# Patient Record
Sex: Female | Born: 1990 | Race: Black or African American | Hispanic: No | Marital: Single | Smoking: Never smoker
Health system: Southern US, Community
[De-identification: ages and names within clinical notes are randomized; demographics above are authoritative.]

## PROBLEM LIST (undated history)

## (undated) DIAGNOSIS — K589 Irritable bowel syndrome without diarrhea: Secondary | ICD-10-CM

## (undated) HISTORY — PX: OTHER SURGICAL HISTORY: SHX169

## (undated) HISTORY — DX: Irritable bowel syndrome without diarrhea: K58.9

---

## 2012-11-05 DIAGNOSIS — K589 Irritable bowel syndrome without diarrhea: Secondary | ICD-10-CM

## 2012-11-05 HISTORY — DX: Irritable bowel syndrome, unspecified: K58.9

## 2013-03-28 ENCOUNTER — Ambulatory Visit (INDEPENDENT_AMBULATORY_CARE_PROVIDER_SITE_OTHER): Payer: BC Managed Care – PPO | Admitting: Emergency Medicine

## 2013-03-28 VITALS — BP 118/80 | HR 93 | Temp 98.5°F | Resp 16 | Ht 67.0 in | Wt 238.4 lb

## 2013-03-28 DIAGNOSIS — E041 Nontoxic single thyroid nodule: Secondary | ICD-10-CM

## 2013-03-28 DIAGNOSIS — R109 Unspecified abdominal pain: Secondary | ICD-10-CM

## 2013-03-28 DIAGNOSIS — R110 Nausea: Secondary | ICD-10-CM

## 2013-03-28 LAB — POCT URINALYSIS DIPSTICK
Bilirubin, UA: NEGATIVE
Blood, UA: NEGATIVE
Nitrite, UA: NEGATIVE
Protein, UA: NEGATIVE
Urobilinogen, UA: 0.2
pH, UA: 6

## 2013-03-28 LAB — POCT CBC
Granulocyte percent: 56.3 %G (ref 37–80)
HCT, POC: 36.8 % — AB (ref 37.7–47.9)
MCHC: 30.4 g/dL — AB (ref 31.8–35.4)
MID (cbc): 0.3 (ref 0–0.9)
MPV: 9.6 fL (ref 0–99.8)
POC Granulocyte: 4.2 (ref 2–6.9)
POC LYMPH PERCENT: 40.3 %L (ref 10–50)
POC MID %: 3.4 %M (ref 0–12)
Platelet Count, POC: 335 10*3/uL (ref 142–424)
RDW, POC: 14.8 %

## 2013-03-28 LAB — POCT UA - MICROSCOPIC ONLY
Casts, Ur, LPF, POC: NEGATIVE
Crystals, Ur, HPF, POC: NEGATIVE
Yeast, UA: NEGATIVE

## 2013-03-28 LAB — POCT URINE PREGNANCY: Preg Test, Ur: NEGATIVE

## 2013-03-28 LAB — COMPREHENSIVE METABOLIC PANEL
ALT: 8 U/L (ref 0–35)
AST: 11 U/L (ref 0–37)
Albumin: 4.1 g/dL (ref 3.5–5.2)
Alkaline Phosphatase: 71 U/L (ref 39–117)
BUN: 10 mg/dL (ref 6–23)
Calcium: 9.7 mg/dL (ref 8.4–10.5)
Chloride: 102 mEq/L (ref 96–112)
Creat: 0.63 mg/dL (ref 0.50–1.10)
Glucose, Bld: 89 mg/dL (ref 70–99)
Potassium: 4.3 mEq/L (ref 3.5–5.3)

## 2013-03-28 LAB — TSH: TSH: 0.946 u[IU]/mL (ref 0.350–4.500)

## 2013-03-28 MED ORDER — ONDANSETRON 8 MG PO TBDP: 8.0000 mg | ORAL_TABLET | Freq: Three times a day (TID) | ORAL | Status: DC | PRN

## 2013-03-28 NOTE — Patient Instructions (Signed)
Taking omeprazole twice a day. He have Zofran at the pharmacy for nausea. If he starts to develop pain in her lower abdomen or fever please been evaluated by a physician. You're going to be scheduled for an ultrasound of abdomen

## 2013-03-28 NOTE — Progress Notes (Addendum)
This chart was scribed for Lesle Chris, MD by Joaquin Music, ED Scribe. This patient was seen in room Room/bed 11 and the patient's care was started at 2:38 PM. Subjective:    Patient ID: Penny Grant, female    DOB: [birth year], 22 y.o.   MRN: [id number] Chief Complaint  Patient presents with  . Abdominal Pain    X this morning, upper middle, feels like "someone pushing"   HPI Penny Grant is a 22 y.o. female with a hx of IVS and constipation who presents to the [facility] complaining of ongoing worsening upper abd pain that began this morning. Pt states she has had recurrent episodes of her current symptoms. Pt states she has been nauseated and reports the need to be by a trash can to avoid episodes of emesis since last night until today. Pt states she was having abd pain that was constant. She reports eating crackers, water, and ginger ale yesterday and states she still had abd pain. Pt states her biggest problem is her unchanged abd pain. Pt denies fever, chills, burning, pain, frequency and itching with urination. Pt LNMP was November 1st 2014. Pt is not sexually active and reports never being sexually active. Pt denies being stressed. Pt is a Physicist, medical and a Furniture conservator/restorer.  Pt states she was seen at an Urgent Care in [place] and was informed she had IVS and constipation. She states she had the same episodes she currently has. Pt reports taking a generic version of Prilosec. She states she begins taking her medications when she begins to feel bloated and "ill". Pt states she began taking her medication 4 days ago with no relief. Pt denies being seen by a GI specialist. Pt states the provider informed her that they did not suspect she had an ulcer. Pt denies having testing done on her gallbladder.  Review of Systems  Constitutional: Negative for fever and chills.  Gastrointestinal: Positive for nausea and abdominal pain. Negative for vomiting and diarrhea.   Genitourinary: Negative for dysuria, frequency, difficulty urinating and dyspareunia.   Objective:   Physical Exam CONSTITUTIONAL: Well developed/well nourished HEAD: Normocephalic/atraumatic EYES: EOMI/PERRL ENMT: Mucous membranes moist NECK: supple no meningeal signs there is a 2 x 3 cm right thyroid nodule present SPINE:entire spine nontender CV: S1/S2 noted, no murmurs/rubs/gallops noted LUNGS: Lungs are clear to auscultation bilaterally, no apparent distress ABDOMEN: soft, nontender, no rebound or guarding GU:no cva tenderness NEURO: Pt is awake/alert, moves all extremitiesx4 EXTREMITIES: pulses normal, full ROM SKIN: warm, color normal PSYCH: no abnormalities of mood noted Results for orders placed in visit on 03/28/13  POCT CBC      Result Value Range   WBC 7.5  4.6 - 10.2 K/uL   Lymph, poc 3.0  0.6 - 3.4   POC LYMPH PERCENT 40.3  10 - 50 %L   MID (cbc) 0.3  0 - 0.9   POC MID % 3.4  0 - 12 %M   POC Granulocyte 4.2  2 - 6.9   Granulocyte percent 56.3  37 - 80 %G   RBC 4.24  4.04 - 5.48 M/uL   Hemoglobin 11.2 (*) 12.2 - 16.2 g/dL   HCT, POC 78.2 (*) 95.6 - 47.9 %   MCV 86.7  80 - 97 fL   MCH, POC 26.4 (*) 27 - 31.2 pg   MCHC 30.4 (*) 31.8 - 35.4 g/dL   RDW, POC 21.3     Platelet Count, POC 335  142 - 424 K/uL  MPV 9.6  0 - 99.8 fL  POCT URINE PREGNANCY      Result Value Range   Preg Test, Ur Negative    POCT URINALYSIS DIPSTICK      Result Value Range   Color, UA yellow     Clarity, UA clear     Glucose, UA neg     Bilirubin, UA neg     Ketones, UA neg     Spec Grav, UA 1.020     Blood, UA neg     pH, UA 6.0     Protein, UA neg     Urobilinogen, UA 0.2     Nitrite, UA neg     Leukocytes, UA small (1+)    POCT UA - MICROSCOPIC ONLY      Result Value Range   WBC, Ur, HPF, POC 1-3     RBC, urine, microscopic 0-1     Bacteria, U Microscopic trace     Mucus, UA neg     Epithelial cells, urine per micros 0-3     Crystals, Ur, HPF, POC neg     Casts, Ur,  LPF, POC neg     Yeast, UA neg      Triage Vitals:BP 118/80  Pulse 93  Temp(Src) 98.5 F (36.9 C) (Oral)  Resp 16  Ht 5\' 7"  (1.702 m)  Wt 238 lb 6.4 oz (108.138 kg)  BMI 37.33 kg/m2  SpO2 100%  LMP 03/08/2013 Assessment & Plan:  The patient had significant improvement after the Zofran. We'll go ahead and schedule ABDOMEN RULE OUT GALLBLADDER DISEASE. SHE WILL INCREASE HER PRILOSEC TO TWICE A DAY AND I WILL CALL IN A PRESCRIPTION FOR ZOFRAN. OTHER LABS WERE DONE AND PENDING. She also needs a thyroid ultrasound done.  I personally performed the services described in this documentation, which was scribed in my presence. The recorded information has been reviewed and is accurate.

## 2013-03-29 LAB — URINE CULTURE
Colony Count: NO GROWTH
Organism ID, Bacteria: NO GROWTH

## 2013-04-08 ENCOUNTER — Ambulatory Visit
Admission: RE | Admit: 2013-04-08 | Discharge: 2013-04-08 | Disposition: A | Discharge status: Home / Self Care | Payer: BC Managed Care – PPO | Source: Ambulatory Visit | Attending: Emergency Medicine | Admitting: Emergency Medicine

## 2013-04-08 DIAGNOSIS — R110 Nausea: Secondary | ICD-10-CM

## 2013-04-08 DIAGNOSIS — R109 Unspecified abdominal pain: Secondary | ICD-10-CM

## 2013-04-08 DIAGNOSIS — E041 Nontoxic single thyroid nodule: Secondary | ICD-10-CM

## 2013-04-09 ENCOUNTER — Telehealth: Payer: Self-pay | Admitting: Radiology

## 2013-04-09 DIAGNOSIS — E041 Nontoxic single thyroid nodule: Secondary | ICD-10-CM

## 2013-04-09 NOTE — Telephone Encounter (Signed)
Called her, left message for her to call me back.  

## 2013-04-09 NOTE — Telephone Encounter (Signed)
Called her to advise.  

## 2013-04-09 NOTE — Telephone Encounter (Signed)
Patient returned call

## 2013-04-09 NOTE — Telephone Encounter (Signed)
Message copied by Caffie Damme on Wed Apr 09, 2013 12:54 PM ------      Message from: Lesle Chris A      Created: Tue Apr 08, 2013  5:50 PM       Called patient and let her now there is a 3 x 2 cm nodule in the right lobe of the thyroid. This area needs to have a biopsy done. Please schedule with interventional radiology and ultrasound and ultrasound-guided biopsy of the lesion. Call the patient and be sure she is agreeable . Her abdominal ultrasound is normal ------

## 2013-04-16 ENCOUNTER — Other Ambulatory Visit (HOSPITAL_COMMUNITY)
Admission: RE | Admit: 2013-04-16 | Discharge: 2013-04-16 | Disposition: A | Discharge status: Home / Self Care | Payer: BC Managed Care – PPO | Source: Ambulatory Visit | Attending: Diagnostic Radiology | Admitting: Diagnostic Radiology

## 2013-04-16 ENCOUNTER — Ambulatory Visit
Admission: RE | Admit: 2013-04-16 | Discharge: 2013-04-16 | Disposition: A | Discharge status: Home / Self Care | Payer: BC Managed Care – PPO | Source: Ambulatory Visit | Attending: Emergency Medicine | Admitting: Emergency Medicine

## 2013-04-16 DIAGNOSIS — E041 Nontoxic single thyroid nodule: Secondary | ICD-10-CM | POA: Insufficient documentation

## 2013-04-18 ENCOUNTER — Telehealth: Payer: Self-pay | Admitting: Radiology

## 2013-04-18 NOTE — Telephone Encounter (Signed)
Patient returned call, notified, and voiced understanding.

## 2013-04-18 NOTE — Telephone Encounter (Signed)
Called to advise. Left message for her to call back.

## 2013-04-18 NOTE — Telephone Encounter (Signed)
Message copied by Caffie Damme on Fri Apr 18, 2013  2:27 PM ------      Message from: Lesle Chris A      Created: Fri Apr 18, 2013  7:38 AM       The biopsy of of the thyroid shows this is a benign nodule. I would recommend a repeat ultrasound in one year to be sure the lesion is stable. ------

## 2013-11-09 ENCOUNTER — Ambulatory Visit (INDEPENDENT_AMBULATORY_CARE_PROVIDER_SITE_OTHER): Payer: BC Managed Care – PPO | Admitting: Internal Medicine

## 2013-11-09 VITALS — BP 112/68 | HR 91 | Temp 98.1°F | Resp 18 | Ht 67.0 in | Wt 226.0 lb

## 2013-11-09 DIAGNOSIS — M545 Low back pain, unspecified: Secondary | ICD-10-CM

## 2013-11-09 MED ORDER — PREDNISONE 20 MG PO TABS: ORAL_TABLET | ORAL | Status: DC

## 2013-11-09 MED ORDER — CYCLOBENZAPRINE HCL 10 MG PO TABS: 10.0000 mg | ORAL_TABLET | Freq: Every day | ORAL | Status: DC
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
## 2014-01-26 ENCOUNTER — Ambulatory Visit (INDEPENDENT_AMBULATORY_CARE_PROVIDER_SITE_OTHER): Payer: BC Managed Care – PPO

## 2014-01-26 ENCOUNTER — Ambulatory Visit (INDEPENDENT_AMBULATORY_CARE_PROVIDER_SITE_OTHER): Payer: BC Managed Care – PPO | Admitting: Emergency Medicine

## 2014-01-26 ENCOUNTER — Encounter: Payer: Self-pay | Admitting: Gastroenterology

## 2014-01-26 VITALS — BP 116/70 | HR 83 | Temp 98.3°F | Resp 12 | Ht 66.0 in | Wt 235.0 lb

## 2014-01-26 DIAGNOSIS — K589 Irritable bowel syndrome without diarrhea: Secondary | ICD-10-CM

## 2014-01-26 DIAGNOSIS — R197 Diarrhea, unspecified: Secondary | ICD-10-CM

## 2014-01-26 DIAGNOSIS — R1013 Epigastric pain: Secondary | ICD-10-CM

## 2014-01-26 DIAGNOSIS — G8929 Other chronic pain: Secondary | ICD-10-CM

## 2014-01-26 LAB — POCT URINALYSIS DIPSTICK
Blood, UA: NEGATIVE
GLUCOSE UA: NEGATIVE
Ketones, UA: NEGATIVE
NITRITE UA: NEGATIVE
Spec Grav, UA: 1.025
UROBILINOGEN UA: 0.2
pH, UA: 5.5

## 2014-01-26 LAB — POCT CBC
Granulocyte percent: 64.4 %G (ref 37–80)
HCT, POC: 36 % — AB (ref 37.7–47.9)
Hemoglobin: 11.4 g/dL — AB (ref 12.2–16.2)
Lymph, poc: 2.5 (ref 0.6–3.4)
MCH: 27.4 pg (ref 27–31.2)
MCHC: 31.7 g/dL — AB (ref 31.8–35.4)
MCV: 86.5 fL (ref 80–97)
MID (CBC): 0.2 (ref 0–0.9)
MPV: 8.2 fL (ref 0–99.8)
PLATELET COUNT, POC: 318 10*3/uL (ref 142–424)
POC Granulocyte: 4.8 (ref 2–6.9)
POC LYMPH %: 33.3 % (ref 10–50)
POC MID %: 2.3 %M (ref 0–12)
RBC: 4.16 M/uL (ref 4.04–5.48)
RDW, POC: 14.8 %
WBC: 7.4 10*3/uL (ref 4.6–10.2)

## 2014-01-26 LAB — COMPLETE METABOLIC PANEL WITH GFR
ALK PHOS: 83 U/L (ref 39–117)
ALT: 12 U/L (ref 0–35)
AST: 15 U/L (ref 0–37)
Albumin: 4.2 g/dL (ref 3.5–5.2)
BUN: 10 mg/dL (ref 6–23)
CO2: 24 mEq/L (ref 19–32)
CREATININE: 0.7 mg/dL (ref 0.50–1.10)
Calcium: 10 mg/dL (ref 8.4–10.5)
Chloride: 102 mEq/L (ref 96–112)
GFR, Est Non African American: >89 mL/min
Glucose, Bld: 88 mg/dL (ref 70–99)
Potassium: 4.6 mEq/L (ref 3.5–5.3)
SODIUM: 136 meq/L (ref 135–145)
TOTAL PROTEIN: 7.6 g/dL (ref 6.0–8.3)
Total Bilirubin: 0.4 mg/dL (ref 0.2–1.2)

## 2014-01-26 LAB — POCT UA - MICROSCOPIC ONLY
Bacteria, U Microscopic: NEGATIVE
CASTS, UR, LPF, POC: NEGATIVE
CRYSTALS, UR, HPF, POC: NEGATIVE
Mucus, UA: NEGATIVE
RBC, urine, microscopic: NEGATIVE
YEAST UA: NEGATIVE

## 2014-01-26 LAB — POCT URINE PREGNANCY: PREG TEST UR: NEGATIVE

## 2014-01-26 MED ORDER — DICYCLOMINE HCL 20 MG PO TABS: ORAL_TABLET | ORAL | Status: DC

## 2014-01-26 NOTE — Patient Instructions (Signed)
Constipation  Constipation is when a person has fewer than three bowel movements a week, has difficulty having a bowel movement, or has stools that are dry, hard, or larger than normal. As people grow older, constipation is more common. If you try to fix constipation with medicines that make you have a bowel movement (laxatives), the problem may get worse. Long-term laxative use may cause the muscles of the colon to become weak. A low-fiber diet, not taking in enough fluids, and taking certain medicines may make constipation worse.   CAUSES   · Certain medicines, such as antidepressants, pain medicine, iron supplements, antacids, and water pills.    · Certain diseases, such as diabetes, irritable bowel syndrome (IBS), thyroid disease, or depression.    · Not drinking enough water.    · Not eating enough fiber-rich foods.    · Stress or travel.    · Lack of physical activity or exercise.    · Ignoring the urge to have a bowel movement.    · Using laxatives too much.    SIGNS AND SYMPTOMS   · Having fewer than three bowel movements a week.    · Straining to have a bowel movement.    · Having stools that are hard, dry, or larger than normal.    · Feeling full or bloated.    · Pain in the lower abdomen.    · Not feeling relief after having a bowel movement.    DIAGNOSIS   Your health care provider will take a medical history and perform a physical exam. Further testing may be done for severe constipation. Some tests may include:  · A barium enema X-ray to examine your rectum, colon, and, sometimes, your small intestine.    · A sigmoidoscopy to examine your lower colon.    · A colonoscopy to examine your entire colon.  TREATMENT   Treatment will depend on the severity of your constipation and what is causing it. Some dietary treatments include drinking more fluids and eating more fiber-rich foods. Lifestyle treatments may include regular exercise. If these diet and lifestyle recommendations do not help, your health care  provider may recommend taking over-the-counter laxative medicines to help you have bowel movements. Prescription medicines may be prescribed if over-the-counter medicines do not work.   HOME CARE INSTRUCTIONS   · Eat foods that have a lot of fiber, such as fruits, vegetables, whole grains, and beans.  · Limit foods high in fat and processed sugars, such as french fries, hamburgers, cookies, candies, and soda.    · A fiber supplement may be added to your diet if you cannot get enough fiber from foods.    · Drink enough fluids to keep your urine clear or pale yellow.    · Exercise regularly or as directed by your health care provider.    · Go to the restroom when you have the urge to go. Do not hold it.    · Only take over-the-counter or prescription medicines as directed by your health care provider. Do not take other medicines for constipation without talking to your health care provider first.    SEEK IMMEDIATE MEDICAL CARE IF:   · You have bright red blood in your stool.    · Your constipation lasts for more than 4 days or gets worse.    · You have abdominal or rectal pain.    · You have thin, pencil-like stools.    · You have unexplained weight loss.  MAKE SURE YOU:   · Understand these instructions.  · Will watch your condition.  · Will get help right away if you are not   you have with your health care provider. Irritable Bowel Syndrome Irritable bowel syndrome (IBS) is caused by a disturbance of normal bowel function and is a common digestive disorder. You may also hear this condition called spastic colon, mucous colitis, and irritable colon. There is no cure for IBS. However, symptoms often gradually improve or  disappear with a good diet, stress management, and medicine. This condition usually appears in late adolescence or early adulthood. Women develop it twice as often as men. CAUSES  After food has been digested and absorbed in the small intestine, waste material is moved into the large intestine, or colon. In the colon, water and salts are absorbed from the undigested products coming from the small intestine. The remaining residue, or fecal material, is held for elimination. Under normal circumstances, gentle, rhythmic contractions of the bowel walls push the fecal material along the colon toward the rectum. In IBS, however, these contractions are irregular and poorly coordinated. The fecal material is either retained too long, resulting in constipation, or expelled too soon, producing diarrhea. SIGNS AND SYMPTOMS  The most common symptom of IBS is abdominal pain. It is often in the lower left side of the abdomen, but it may occur anywhere in the abdomen. The pain comes from spasms of the bowel muscles happening too much and from the buildup of gas and fecal material in the colon. This pain:  Can range from sharp abdominal cramps to a dull, continuous ache.  Often worsens soon after eating.  Is often relieved by having a bowel movement or passing gas. Abdominal pain is usually accompanied by constipation, but it may also produce diarrhea. The diarrhea often occurs right after a meal or upon waking up in the morning. The stools are often soft, watery, and flecked with mucus. Other symptoms of IBS include:  Bloating.  Loss of appetite.  Heartburn.  Backache.  Dull pain in the arms or shoulders.  Nausea.  Burping.  Vomiting.  Gas. IBS may also cause symptoms that are unrelated to the digestive system, such as:  Fatigue.  Headaches.  Anxiety.  Shortness of breath.  Trouble concentrating.  Dizziness. These symptoms tend to come and go. DIAGNOSIS  The symptoms of IBS may seem  like symptoms of other, more serious digestive disorders. Your health care provider may want to perform tests to exclude these disorders.  TREATMENT Many medicines are available to help correct bowel function or relieve bowel spasms and abdominal pain. Among the medicines available are:  Laxatives for severe constipation and to help restore normal bowel habits.  Specific antidiarrheal medicines to treat severe or lasting diarrhea.  Antispasmodic agents to relieve intestinal cramps. Your health care provider may also decide to treat you with a mild tranquilizer or sedative during unusually stressful periods in your life. Your health care provider may also prescribe antidepressant medicine. The use of this medicine has been shown to reduce pain and other symptoms of IBS. Remember that if any medicine is prescribed for you, you should take it exactly as directed. Make sure your health care provider knows how well it worked for you. HOME CARE INSTRUCTIONS   Take all medicines as directed by your health care provider.  Avoid foods that are high in fat or oils, such as heavy cream, butter, frankfurters, sausage, and other fatty meats.  Avoid foods that make you go to the bathroom, such as fruit, fruit juice, and dairy products.  Cut out carbonated drinks, chewing gum, and "gassy" foods such as beans and  cabbage. This may help relieve bloating and burping.  Eat foods with bran, and drink plenty of liquids with the bran foods. This helps relieve constipation.  Keep track of what foods seem to bring on your symptoms.  Avoid emotionally charged situations or circumstances that produce anxiety.  Start or continue exercising.  Get plenty of rest and sleep. Document Released: 04/24/2005 Document Revised: 04/29/2013 Document Reviewed: 12/13/2007 [facility] Patient Information 2015 [place], [place]. This information is not intended to replace advice given to you by your health care provider. Make sure  you discuss any questions you have with your health care provider.

## 2014-01-26 NOTE — Progress Notes (Signed)
Subjective:    Patient ID: Penny Grant, female    DOB: [birth year], 23 y.o.   MRN: [id number]  HPI Pt complains of upper abd pain. She had many episodes of diarrhea yesterday. She has taken some antacids and some Pepto Bismol. She has been dx with IBS. She usually has constipation with this. She is in school at [employer]. LMP was 2 weeks ago. Pt is not sexually active, no possibility of pregnancy. She denies being more constipated recently. She usually takes Dulcolax. She denies any fever.   Review of Systems     Objective:   Physical Exam  Constitutional: She appears well-developed and well-nourished.  Eyes: Pupils are equal, round, and reactive to light.  Neck: No thyromegaly present.  Cardiovascular: Normal rate and regular rhythm.   Pulmonary/Chest: Effort normal and breath sounds normal.  Abdominal:  The abdomen is flat there are normal bowel sounds there are no specific areas of tenderness in the lower abdomen. There is  Mild  mid epigastric tenderness to   Results for orders placed in visit on 01/26/14  POCT CBC      Result Value Ref Range   WBC 7.4  4.6 - 10.2 K/uL   Lymph, poc 2.5  0.6 - 3.4   POC LYMPH PERCENT 33.3  10 - 50 %L   MID (cbc) 0.2  0 - 0.9   POC MID % 2.3  0 - 12 %M   POC Granulocyte 4.8  2 - 6.9   Granulocyte percent 64.4  37 - 80 %G   RBC 4.16  4.04 - 5.48 M/uL   Hemoglobin 11.4 (*) 12.2 - 16.2 g/dL   HCT, POC 40.9 (*) 81.1 - 47.9 %   MCV 86.5  80 - 97 fL   MCH, POC 27.4  27 - 31.2 pg   MCHC 31.7 (*) 31.8 - 35.4 g/dL   RDW, POC 91.4     Platelet Count, POC 318  142 - 424 K/uL   MPV 8.2  0 - 99.8 fL  POCT UA - MICROSCOPIC ONLY      Result Value Ref Range   WBC, Ur, HPF, POC 0-2     RBC, urine, microscopic neg     Bacteria, U Microscopic neg     Mucus, UA neg     Epithelial cells, urine per micros 0-1     Crystals, Ur, HPF, POC neg     Casts, Ur, LPF, POC neg     Yeast, UA neg    POCT URINALYSIS DIPSTICK      Result Value Ref Range   Color,  UA yellow     Clarity, UA clear     Glucose, UA neg     Bilirubin, UA small     Ketones, UA neg     Spec Grav, UA 1.025     Blood, UA neg     pH, UA 5.5     Protein, UA trace     Urobilinogen, UA 0.2     Nitrite, UA neg     Leukocytes, UA Trace    POCT URINE PREGNANCY      Result Value Ref Range   Preg Test, Ur Negative     [facility] reading (PRIMARY) by  Dr.Taris Galindo is large amount of gas in the large bowel. There a pill fragments seen in the right lower abdomen and possibly Pepto-Bismol or tums. Please comment on calcific density approximately 1 cm adjacent to  L1 on the right .  Assessment & Plan:   Will try bentyl for cramps. MiraLax for constipation GI evaluation if she continues to have problems. Comprehensive metabolic panel was added .

## 2014-02-12 ENCOUNTER — Other Ambulatory Visit (INDEPENDENT_AMBULATORY_CARE_PROVIDER_SITE_OTHER): Payer: BC Managed Care – PPO

## 2014-02-12 ENCOUNTER — Encounter: Payer: Self-pay | Admitting: Gastroenterology

## 2014-02-12 ENCOUNTER — Ambulatory Visit (INDEPENDENT_AMBULATORY_CARE_PROVIDER_SITE_OTHER): Payer: BC Managed Care – PPO | Admitting: Gastroenterology

## 2014-02-12 VITALS — BP 118/82 | HR 64 | Ht 66.0 in | Wt 239.4 lb

## 2014-02-12 DIAGNOSIS — R140 Abdominal distension (gaseous): Secondary | ICD-10-CM

## 2014-02-12 DIAGNOSIS — K589 Irritable bowel syndrome without diarrhea: Secondary | ICD-10-CM

## 2014-02-12 LAB — IGA: IGA: 62 mg/dL — AB (ref 68–378)

## 2014-02-12 MED ORDER — OMEPRAZOLE 40 MG PO CPDR: 40.0000 mg | DELAYED_RELEASE_CAPSULE | Freq: Every day | ORAL | Status: DC

## 2014-02-12 NOTE — Patient Instructions (Signed)
Lactose Diet is below for your review  Please call the first week in November to make a follow up appointment with Doug SouJessica Zehr, PA-C   Your physician has requested that you go to the basement for the following lab work before leaving today: TTG IGA  Please purchase Miralax over the counter and take as directed once daily  Omeprazole 40 mg, was sent to your pharmacy Please take one capsule by mouth thirty minutes before breakfast  Please take your Bentyl as needed for stomach cramping  _____________________________________________________________________________________________________________________________________________________________________________________  Lactose Intolerance, Adult Lactose intolerance is when the body is not able to digest lactose, a sugar found in milk and milk products. Lactose intolerance is caused by your body not producing enough of the enzyme lactase. When there is not enough lactase to digest the amount of lactose consumed, discomfort may be felt. Lactose intolerance is not a milk allergy. For most people, lactase deficiency is a condition that develops naturally over time. After about the age of 2, the body begins to produce less lactase. But many people may not experience symptoms until they are much older. CAUSES Things that can cause you to be lactose intolerant include:  Aging.  Being born without the ability to make lactase.  Certain digestive diseases.  Injuries to the small intestine. SYMPTOMS   Feeling sick to your stomach (nauseous).  Diarrhea.  Cramps.  Bloating.  Gas. Symptoms usually show up a half hour or 2 hours after eating or drinking products containing lactose. TREATMENT  No treatment can improve the body's ability to produce lactase. However, symptoms can be controlled through diet. A medicine may be given to you to take when you consume lactose-containing foods or drinks. The medicine contains the lactase enzyme, which  help the body digest lactose better. HOME CARE INSTRUCTIONS  Eat or drink dairy products as told by your caregiver or dietician.  Take all medicine as directed by your caregiver.  Find lactose-free or lactose-reduced products at your local grocery store.  Talk to your caregiver or dietician to decide if you need any dietary supplements. The following is the amount of calcium needed from the diet:  19 to 50 years: 1000 mg  Over 50 years: 1200 mg Calcium and Lactose in Common Foods Non-Dairy Products / Calcium Content (mg)  Calcium-fortified orange juice, 1 cup / 308 to 344 mg  Sardines, with edible bones, 3 oz / 270 mg  Salmon, canned, with edible bones, 3 oz / 205 mg  Soymilk, fortified, 1 cup / 200 mg  Broccoli (raw), 1 cup / 90 mg  Orange, 1 medium / 50 mg  Pinto beans,  cup / 40 mg  Tuna, canned, 3 oz / 10 mg  Lettuce greens,  cup / 10 mg Dairy Products / Calcium Content (mg) / Lactose Content (g)  Yogurt, plain, low-fat, 1 cup / 415 mg / 5 g  Milk, reduced fat, 1 cup / 295 mg / 11 g  Swiss cheese, 1 oz / 270 mg / 1 g  Ice cream,  cup / 85 mg / 6 g  Cottage cheese,  cup / 75 mg / 2 to 3 g SEEK MEDICAL CARE IF: You have no relief from your symptoms. Document Released: 04/24/2005 Document Revised: 07/17/2011 Document Reviewed: 07/25/2013 [facility] Patient Information 2015 [place]. This information is not intended to replace advice given to you by your health care provider. Make sure you discuss any questions you have with your health care provider.

## 2014-02-16 LAB — T-TRANSGLUTAMINASE (TTG) IGG: Tissue Transglut Ab: 2 U/mL (ref 0–5)

## 2014-02-18 ENCOUNTER — Other Ambulatory Visit: Payer: Self-pay | Admitting: *Deleted

## 2014-02-18 DIAGNOSIS — K589 Irritable bowel syndrome without diarrhea: Secondary | ICD-10-CM | POA: Insufficient documentation

## 2014-02-18 DIAGNOSIS — R1013 Epigastric pain: Secondary | ICD-10-CM

## 2014-02-18 DIAGNOSIS — R140 Abdominal distension (gaseous): Secondary | ICD-10-CM | POA: Insufficient documentation

## 2014-02-18 NOTE — Progress Notes (Signed)
02/18/2014 Penny Grant [id number] 09-22-1990   HISTORY OF PRESENT ILLNESS:  This is a pleasant 23 year old female who is new to our practice and is coming in today to discuss management of what she thinks is IBS.  She says that all of her symptoms started over a year ago at which time she was working at a very stressful job and her symptoms have been somewhat persistent since then. She complains of diffuse abdominal bloating and discomfort in her epigastrium. She also complains of constipation and says that sometimes she only has a bowel movement once a week. She takes a probiotic called Insync and uses Dulcolax intermittently, which then cleans her out until the constipation starts again. She has some nausea as well, but denies any actual vomiting. She denies any dark or bloody stools. She cannot really identify any particular foods that seem to worsen her symptoms. She had an abdominal ultrasound in December 2014 at which time the study was unremarkable; portions of the pancreas were obscured by bowel gas, however. Recent TSH, CMP, lipase/amylase, and CBC were unremarkable except for a slightly low hemoglobin of 11.4 g, which is stable from 11.2 g approximately 10 months ago.   Past Medical History  Diagnosis Date  . Irritable bowel syndrome July 2014   History reviewed. No pertinent past surgical history.  reports that she has never smoked. She does not have any smokeless tobacco history on file. She reports that she does not drink alcohol or use illicit drugs. family history includes Diabetes in her father and maternal grandfather; Heart disease in her father; Hyperlipidemia in her maternal grandfather; Hypertension in her father, maternal grandfather, and mother; Kidney disease in her maternal grandfather; Lung cancer in her maternal grandmother; Pancreatic cancer in her maternal grandfather. There is no history of Colon cancer, Colon polyps, or Esophageal cancer. No Known  Allergies    Outpatient Encounter Prescriptions as of 02/12/2014  Medication Sig  . desogestrel-ethinyl estradiol (VIORELE) 0.15-0.02/0.01 MG (21/5) tablet Take 1 tablet by mouth daily. Pimtrea, 1 tablet, by mouth, daily  . dicyclomine (BENTYL) 20 MG tablet Take one tablet every 6-8 hours as needed for abdominal cramping  . GLYCOPYRROLATE PO Take 1 tablet by mouth 4 (four) times daily. For sweating  . Probiotic Product (PROBIOTIC DAILY PO) Take 1 tablet by mouth daily. Pt taking Insync  . omeprazole (PRILOSEC) 40 MG capsule Take 1 capsule (40 mg total) by mouth daily.  . [DISCONTINUED] cyclobenzaprine (FLEXERIL) 10 MG tablet Take 1 tablet (10 mg total) by mouth at bedtime.  . [DISCONTINUED] predniSONE (DELTASONE) 20 MG tablet 3/3/2/2/1/1 single daily dose for 6 days     REVIEW OF SYSTEMS  : All other systems reviewed and negative except where noted in the History of Present Illness.   PHYSICAL EXAM: BP 118/82  Pulse 64  Ht 5\' 6"  (1.676 m)  Wt 239 lb 6 oz (108.58 kg)  BMI 38.65 kg/m2  LMP 02/09/2014 General: Well developed black female in no acute distress Head: Normocephalic and atraumatic Eyes:  Sclerae anicteric, conjunctiva pink. Ears: Normal auditory acuity Lungs: Clear throughout to auscultation Heart: Regular rate and rhythm Abdomen: Soft, non-distended.  Normal bowel sounds.  Mild diffuse TTP without R/R/G. Musculoskeletal: Symmetrical with no gross deformities  Skin: No lesions on visible extremities Extremities: No edema  Neurological: Alert oriented x 4, grossly non-focal Psychological:  Alert and cooperative. Normal mood and affect  ASSESSMENT AND PLAN: -Multiple complaints including bloating, abdominal discomfort, constipation, etc.  Most likely has IBS.  Will check celiac labs as well as sed rate and CRP.  We will give her lactose intolerance diet to try to follow and see if that helps with her symptoms.  She will start Miralax daily for her constipation.  Can use  dicyclomine as needed for abdominal cramping/spas. -Epigastric abdominal pain:  ? Gastritis vs ulcer disease vs GERD/esophagitis, etc.  Will place her on omeprazole 40 mg daily.  *Will follow-up in approximately 6 weeks.

## 2014-02-19 NOTE — Progress Notes (Signed)
Reviewed and agree with management.  May consider Linzess if constipation is not relieved with MiraLax alone. Barbette Hairobert D. Arlyce DiceKaplan, M.D., [facility]

## 2014-03-01 NOTE — Progress Notes (Signed)
Sounds like constipation predominant IBS.  If sxs don't improve with miralax would try high dose Linzess

## 2014-03-07 ENCOUNTER — Ambulatory Visit (INDEPENDENT_AMBULATORY_CARE_PROVIDER_SITE_OTHER): Payer: BC Managed Care – PPO | Admitting: Emergency Medicine

## 2014-03-07 VITALS — BP 116/80 | HR 88 | Temp 98.3°F | Resp 18 | Ht 66.0 in | Wt 236.2 lb

## 2014-03-07 DIAGNOSIS — R21 Rash and other nonspecific skin eruption: Secondary | ICD-10-CM

## 2014-03-07 LAB — POCT CBC
GRANULOCYTE PERCENT: 40 % (ref 37–80)
HEMATOCRIT: 34.3 % — AB (ref 37.7–47.9)
Hemoglobin: 11 g/dL — AB (ref 12.2–16.2)
Lymph, poc: 2.5 (ref 0.6–3.4)
MCH, POC: 27.3 pg (ref 27–31.2)
MCHC: 32 g/dL (ref 31.8–35.4)
MCV: 85.4 fL (ref 80–97)
MID (CBC): 0.3 (ref 0–0.9)
MPV: 8.1 fL (ref 0–99.8)
PLATELET COUNT, POC: 286 10*3/uL (ref 142–424)
POC Granulocyte: 1.8 — AB (ref 2–6.9)
POC LYMPH %: 53.9 % — AB (ref 10–50)
POC MID %: 6.1 %M (ref 0–12)
RBC: 4.02 M/uL — AB (ref 4.04–5.48)
RDW, POC: 13.5 %
WBC: 4.6 10*3/uL (ref 4.6–10.2)

## 2014-03-07 LAB — POCT SEDIMENTATION RATE: POCT SED RATE: 38 mm/hr — AB (ref 0–22)

## 2014-03-07 LAB — RPR

## 2014-03-07 NOTE — Progress Notes (Signed)
[facility] [address], [place] [id number] [phone]  Date:  03/07/2014   Name:  Penny LoosenMykayla Grant   DOB:  [birth year]   MRN:  [id number]  PCP:  Pcp Not In System    Chief Complaint: Rash   History of Present Illness:  Penny Grant is a 23 y.o. very pleasant female patient who presents with the following:  Has a rash that developed a week ago and started on the hands and feet (soles and palms).  Now resolving on hands and has spread to proximal upper and lower extremities Pruritic on arms.  Was associated with pain in the hands and feet that has resolved. No sexually transmitted disease history.  Not sexually active (virgin). No cough or coryza, sore throat or fever or chills. No antecedent illness No nausea vomiting or diarrhea Works in childcare. Denies other complaint or health concern today.    Patient Active Problem List   Diagnosis Date Noted  . IBS (irritable bowel syndrome) 02/18/2014  . Bloating 02/18/2014  . Thyroid nodule 03/28/2013    Past Medical History  Diagnosis Date  . Irritable bowel syndrome July 2014    History reviewed. No pertinent past surgical history.  History  Substance Use Topics  . Smoking status: Never Smoker   . Smokeless tobacco: Not on file  . Alcohol Use: No    Family History  Problem Relation Age of Onset  . Hypertension Mother   . Hypertension Father   . Heart disease Father   . Diabetes Father   . Lung cancer Maternal Grandmother   . Pancreatic cancer Maternal Grandfather   . Diabetes Maternal Grandfather   . Hyperlipidemia Maternal Grandfather   . Hypertension Maternal Grandfather   . Kidney disease Maternal Grandfather   . Colon cancer Neg Hx   . Colon polyps Neg Hx   . Esophageal cancer Neg Hx     No Known Allergies  Medication list has been reviewed and updated.  Current Outpatient Prescriptions on File Prior to Visit  Medication Sig Dispense Refill  .  desogestrel-ethinyl estradiol (VIORELE) 0.15-0.02/0.01 MG (21/5) tablet Take 1 tablet by mouth daily. Pimtrea, 1 tablet, by mouth, daily      . dicyclomine (BENTYL) 20 MG tablet Take one tablet every 6-8 hours as needed for abdominal cramping  30 tablet  1  . GLYCOPYRROLATE PO Take 1 tablet by mouth 4 (four) times daily. For sweating      . omeprazole (PRILOSEC) 40 MG capsule Take 1 capsule (40 mg total) by mouth daily.  90 capsule  3  . Probiotic Product (PROBIOTIC DAILY PO) Take 1 tablet by mouth daily. Pt taking Insync       No current facility-administered medications on file prior to visit.    Review of Systems:  As per HPI, otherwise negative.    Physical Examination: Filed Vitals:   03/07/14 0840  BP: 116/80  Pulse: 88  Temp: 98.3 F (36.8 C)  Resp: 18   Filed Vitals:   03/07/14 0840  Height: 5\' 6"  (1.676 m)  Weight: 236 lb 3.2 oz (107.14 kg)   Body mass index is 38.14 kg/(m^2). Ideal Body Weight: Weight in (lb) to have BMI = 25: 154.6  GEN: WDWN, NAD, Non-toxic, A & O x 3 HEENT: Atraumatic, Normocephalic. Neck supple. No masses, No LAD. Ears and Nose: No external deformity. CV: RRR, No M/G/R. No JVD. No thrill. No extra heart sounds. PULM: CTA B, no wheezes, crackles, rhonchi. No  retractions. No resp. distress. No accessory muscle use. ABD: S, NT, ND, +BS. No rebound. No HSM. EXTR: No c/c/e NEURO Normal gait.  PSYCH: Normally interactive. Conversant. Not depressed or anxious appearing.  Calm demeanor.  SKIN:  Erythematous papular rash on the palms, and extremities  Assessment and Plan: Erythematous rash   Signed,  Phillips OdorJeffery Tekoa Amon, MD

## 2014-03-07 NOTE — Patient Instructions (Signed)
Viral Exanthems, Adult °Many viral infections of the skin are called viral exanthems. Exanthem is another name for a rash or skin eruption. The most common viral exanthems include the following: °· German measles or rubella. °· Measles or rubeola. °· Roseola. °· Parvovirus B19 (Erythema infectiosum or Fifth disease). °· Chickenpox or varicella. °DIAGNOSIS  °Sometimes, other problems may cause a rash that looks like a viral exanthem. Most often, your caregiver can determine whether you have a viral exanthem by looking at the rash. They usually have distinct patterns or appearance. Lab work may be done if the diagnosis is uncertain. Sometimes, a small tissue sample (biopsy) of the rash may need to be taken. °TREATMENT  °Immunizations have led to a decrease in the number of cases of measles, mumps, and rubella. Viral exanthems may require clinical treatment if a bacterial infection or other problems follow. The rash may be associated with: °· Minor sore throat. °· Aches and pains. °· Runny nose. °· Watery eyes. °· Tiredness. °· Some coughs. °· Gastrointestinal infections causing nausea, vomiting, and diarrhea. °Viral exanthems do not respond to antibiotic medicines, because they are not caused by bacteria. °HOME CARE INSTRUCTIONS  °· Only take over-the-counter or prescription medicines for pain, discomfort, diarrhea, or fever as directed by your caregiver. °· Drink enough water and fluids to keep your urine clear or pale yellow. °SEEK MEDICAL CARE IF: °· You develop swollen neck glands. This may feel like lumps or bumps in the neck. °· You develop tenderness over your sinuses. °· You are not feeling partly better after 3 days. °· You develop muscle aches. °· You are feeling more tired than you would expect. °· You get a persistent cough with mucus. °SEEK IMMEDIATE MEDICAL CARE IF:  °· You have a fever. °· You develop red eyes or eye pain. °· You develop sores in your mouth and difficulty drinking or eating. °· You  develop a sore throat with pus and difficulty swallowing. °· You develop neck pain or a stiff neck. °· You develop a severe headache. °· You develop vomiting that will not stop. °Document Released: 07/15/2002 Document Revised: 07/17/2011 Document Reviewed: 07/12/2010 °ExitCare® Patient Information ©2015 [employer]. This information is not intended to replace advice given to you by your health care provider. Make sure you discuss any questions you have with your health care provider. ° °

## 2014-03-09 ENCOUNTER — Telehealth: Payer: Self-pay | Admitting: Gastroenterology

## 2014-03-09 NOTE — Telephone Encounter (Signed)
Left a message for patient to call back. 

## 2014-03-10 NOTE — Telephone Encounter (Signed)
Spoke with patient and scheduled OV with Doug SouJessica Zehr, PA on 12/24/13 at 8:30 AM.

## 2014-03-15 ENCOUNTER — Ambulatory Visit (INDEPENDENT_AMBULATORY_CARE_PROVIDER_SITE_OTHER): Payer: BC Managed Care – PPO | Admitting: Physician Assistant

## 2014-03-15 VITALS — BP 128/72 | HR 78 | Temp 98.5°F | Resp 16 | Ht 68.0 in | Wt 237.0 lb

## 2014-03-15 DIAGNOSIS — L500 Allergic urticaria: Secondary | ICD-10-CM

## 2014-03-15 MED ORDER — PREDNISONE 20 MG PO TABS: ORAL_TABLET | ORAL | Status: DC

## 2014-03-15 MED ORDER — CETIRIZINE HCL 10 MG PO TABS: 10.0000 mg | ORAL_TABLET | Freq: Every day | ORAL | Status: DC

## 2014-03-15 NOTE — Progress Notes (Signed)
   Subjective:    Patient ID: Penny Grant, female    DOB: 1991/01/16, 23 y.o.   MRN: [id number] Patient Active Problem List   Diagnosis Date Noted  . IBS (irritable bowel syndrome) 02/18/2014  . Bloating 02/18/2014  . Thyroid nodule 03/28/2013   Prior to Admission medications   Medication Sig Start Date End Date Taking? Authorizing Provider  desogestrel-ethinyl estradiol (VIORELE) 0.15-0.02/0.01 MG (21/5) tablet Take 1 tablet by mouth daily. Pimtrea, 1 tablet, by mouth, daily   Yes Historical Provider, MD  dicyclomine (BENTYL) 20 MG tablet Take one tablet every 6-8 hours as needed for abdominal cramping 01/26/14  Yes Collene GobbleSteven A Daub, MD  GLYCOPYRROLATE PO Take 1 tablet by mouth 4 (four) times daily. For sweating   Yes Historical Provider, MD  omeprazole (PRILOSEC) 40 MG capsule Take 1 capsule (40 mg total) by mouth daily. 02/12/14  Yes Jessica D. Zehr, PA-C  Probiotic Product (PROBIOTIC DAILY PO) Take 1 tablet by mouth daily. Pt taking Insync   Yes Historical Provider, MD                 No Known Allergies  HPI  This is a 23 year old female with PMH IBS who is here for a rash x 7 days. She was seen here 7 days ago and it was thought the rash was viral d/t increased lymphocytes on CBC. She was told to use benadryl. This rash is pruritic and changes locations. She has never had problems with this before now. Her rash started to improve however 2 new spots on her bilateral forearms showed up 3 days ago. She cannot recall any new exposures except for a new detergent she started 2 months ago. She does not have environmental allergies. She is not having any fevers or chills. At last visit her sed rate was slightly elevated to 38, RPR negative.  Review of Systems  Constitutional: Negative for fever and chills.  HENT: Negative.   Skin: Positive for rash.  Allergic/Immunologic: Negative for environmental allergies.      Objective:   Physical Exam  Constitutional: She is oriented to  person, place, and time. She appears well-developed and well-nourished. No distress.  HENT:  Head: Normocephalic and atraumatic.  Right Ear: Hearing normal.  Left Ear: Hearing normal.  Nose: Nose normal.  Eyes: Conjunctivae and lids are normal. Right eye exhibits no discharge. Left eye exhibits no discharge. No scleral icterus.  Pulmonary/Chest: Effort normal. No respiratory distress.  Musculoskeletal: Normal range of motion.  Neurological: She is alert and oriented to person, place, and time.  Skin: Skin is warm, dry and intact. Rash (raised welts on her bilateral forearms and her left upper arm. positive dermatographism) noted. Rash is urticarial.     Psychiatric: She has a normal mood and affect. Her speech is normal and behavior is normal. Thought content normal.      Assessment & Plan:  1. Allergic urticaria She will stop using the benadryl and start daily zyrtec and a trial of prednisone. Will return in 7-10 days if symptoms worsen or fail to improve.  - predniSONE (DELTASONE) 20 MG tablet; Take 3 PO QAM x3days, 2 PO QAM x3days, 1 PO QAM x3days  Dispense: 18 tablet; Refill: 0 - cetirizine (ZYRTEC) 10 MG tablet; Take 1 tablet (10 mg total) by mouth daily.  Dispense: 30 tablet; Refill: 11   Jani Moronta V. Dyke BrackettBush, PA-C, MHS [facility]  03/16/2014

## 2014-03-15 NOTE — Patient Instructions (Signed)
Take prednisone until finished. Take zyrtec daily for at least a month.  Return in 7-10 days if no improvement

## 2014-03-17 NOTE — Progress Notes (Signed)
I was directly involved with the patient's care and agree with the physical, diagnosis and treatment plan.  

## 2014-03-26 ENCOUNTER — Ambulatory Visit (INDEPENDENT_AMBULATORY_CARE_PROVIDER_SITE_OTHER): Payer: BC Managed Care – PPO | Admitting: Gastroenterology

## 2014-03-26 ENCOUNTER — Encounter: Payer: Self-pay | Admitting: Gastroenterology

## 2014-03-26 VITALS — BP 130/72 | HR 76 | Ht 66.0 in | Wt 237.4 lb

## 2014-03-26 DIAGNOSIS — R140 Abdominal distension (gaseous): Secondary | ICD-10-CM

## 2014-03-26 DIAGNOSIS — K5909 Other constipation: Secondary | ICD-10-CM

## 2014-03-26 DIAGNOSIS — R1013 Epigastric pain: Secondary | ICD-10-CM

## 2014-03-26 NOTE — Progress Notes (Signed)
     03/26/2014 Penny Grant [id number] 09-02-1990   History of Present Illness:  This is a pleasant 23 year old female who was seen by me for the first time on 02/12/2014 with multiple complaints of abdominal pain, bloating, and constipation that are suspected to be due to IBS.  She had an abdominal ultrasound in December 2014 at which time the study was unremarkable; portions of the pancreas were obscured by bowel gas, however. Recent TSH, CMP, lipase/amylase, and CBC were unremarkable except for a slightly low hemoglobin of 11.4 g.  Sed rate was slightly elevated at 38 two weeks ago when she saw her PCP for a rash; CRP was ordered by our office previously but was never drawn.  Celiac labs were checked from our office but were indeterminate because her IgA levels were low.  When suggested to her about the EGD for small bowel biopsies she had said that she wanted to think about it.  She was placed on omeprazole 40 mg daily and miralax daily.  Is also taking a probiotic.  She returns today saying that she is doing well.  Feels much better; symptoms much improved.  She would like to do the EGD with small bowel biopsies, however.  She has the dicyclomine that she was given at her last visit but has not needed to use it much.     Current Medications, Allergies, Past Medical History, Past Surgical History, Family History and Social History were [facility] electronic [facility].   Physical Exam: BP 130/72 mmHg  Pulse 76  Ht 5\' 6"  (1.676 m)  Wt 237 lb 6.4 oz (107.684 kg)  BMI 38.34 kg/m2  LMP 03/05/2014 General: Well developed black female in no acute distress Head: Normocephalic and atraumatic Eyes:  Sclerae anicteric, conjunctiva pink  Ears: Normal auditory acuity Lungs: Clear throughout to auscultation Heart: Regular rate and rhythm Abdomen: Soft, non-distended.  Normal bowel sounds.  Non-tender. Musculoskeletal: Symmetrical with no gross deformities  Extremities:  No edema  Neurological: Alert oriented x 4, grossly non-focal Psychological:  Alert and cooperative. Normal mood and affect  Assessment and Recommendations: -Epigastric abdominal pain:  Improved on omeprazole 40 mg daily.  Will continue for now. -Multiple complaints including bloating, abdominal discomfort, constipation, etc.  Most likely has IBS.  Improved with Miralax daily.  Has not needed the dicyclomine much but has it to use prn.  Continue probiotic as well.  Her celiac labs were indeterminate due to low IgA so she has decided that she would like to do EGD with biopsies to rule that out definitely.  Will schedule EGD with Dr. Arlyce DiceKaplan.  The risks, benefits, and alternatives were discussed with the patient and she consents to proceed.

## 2014-03-26 NOTE — Patient Instructions (Signed)
You have been scheduled for an endoscopy. Please follow written instructions given to you at your visit today. If you use inhalers (even only as needed), please bring them with you on the day of your procedure. Your physician has requested that you go to www.[email or web address].com and enter the access code given to you at your visit today(SENT TO YOUR E-MAIL). This web site gives a general overview about your procedure. However, you should still follow specific instructions given to you by our office regarding your preparation for the procedure.  

## 2014-03-27 NOTE — Progress Notes (Signed)
Reviewed and agree with management. Luie Laneve D. Wilfrido Luedke, M.D., FACG  

## 2014-04-06 ENCOUNTER — Encounter: Payer: Self-pay | Admitting: Gastroenterology

## 2014-05-28 ENCOUNTER — Encounter: Payer: BC Managed Care – PPO | Admitting: Gastroenterology

## 2014-09-16 IMAGING — US US ABDOMEN COMPLETE
1 series · 14 of 25 positions shown · non-contrast
Comparison: None.

CLINICAL DATA: Abdominal pain

EXAM:
ULTRASOUND ABDOMEN COMPLETE

[Series 1: us abdomen complete · 0.23mm/px · 14 of 60 slices shown]
[im 1/60]
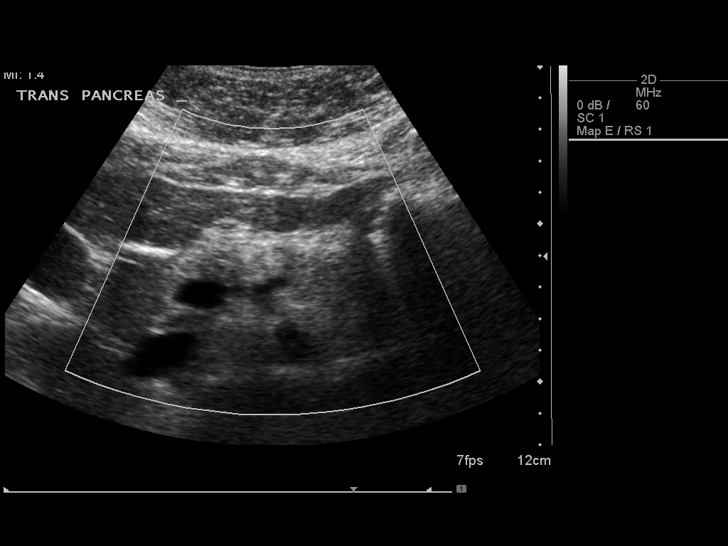
[im 5/60]
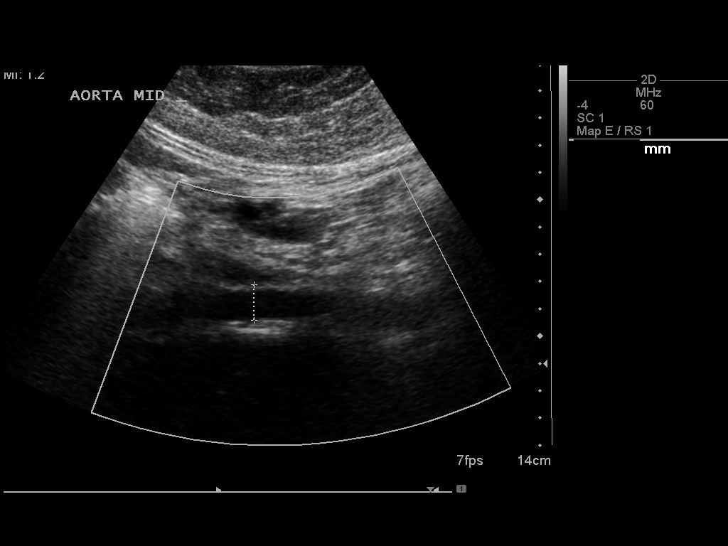
[im 10/60]
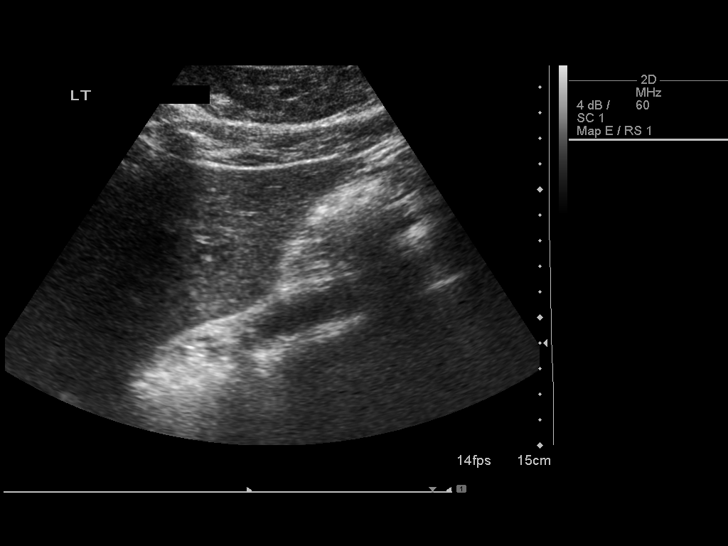
[im 15/60]
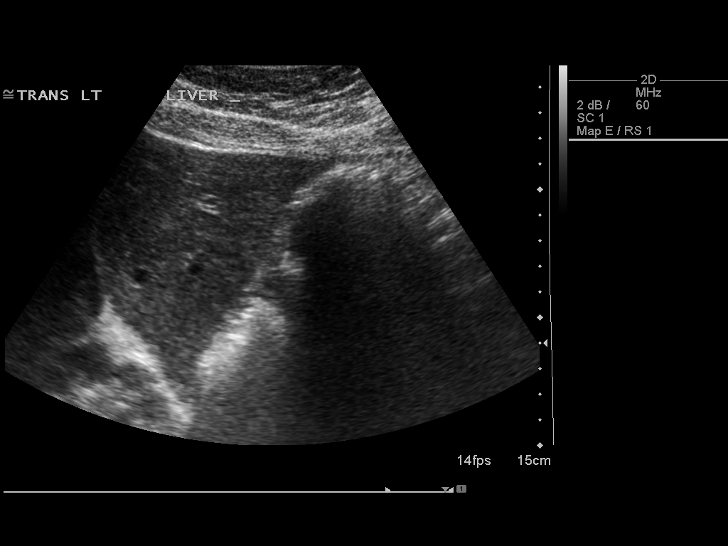
[im 20/60]
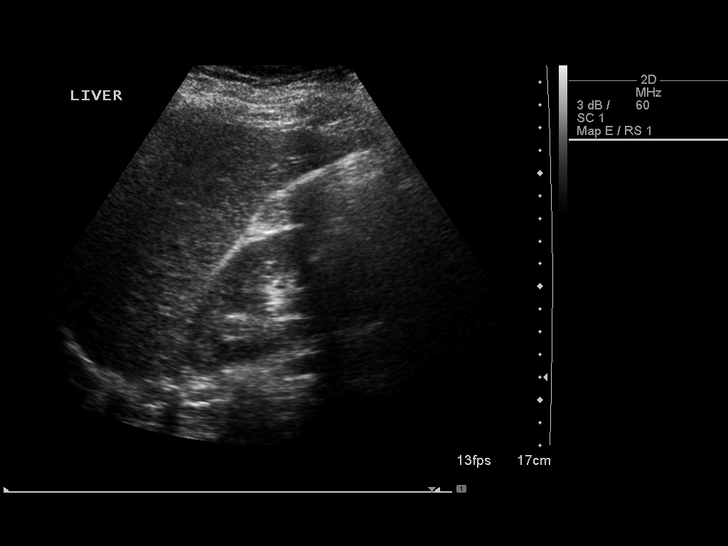
[im 23/60]
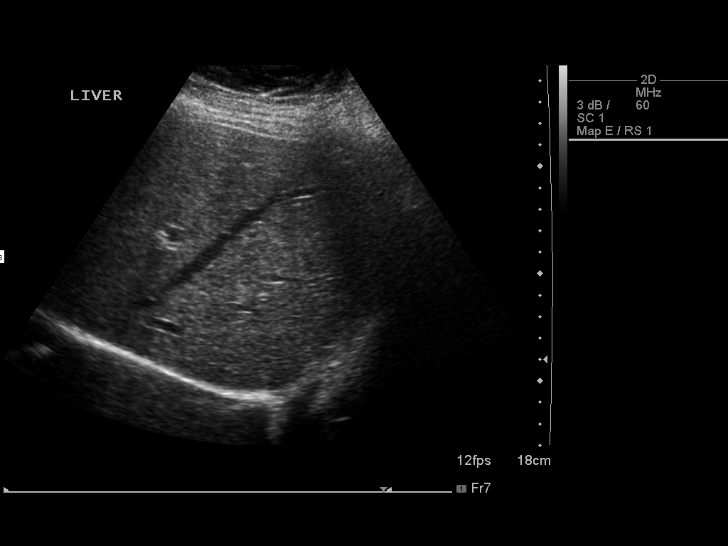
[im 28/60]
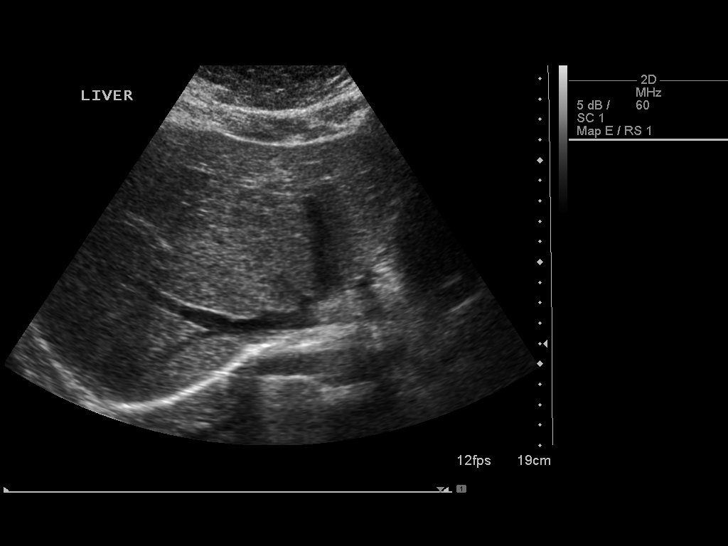
[im 32/60]
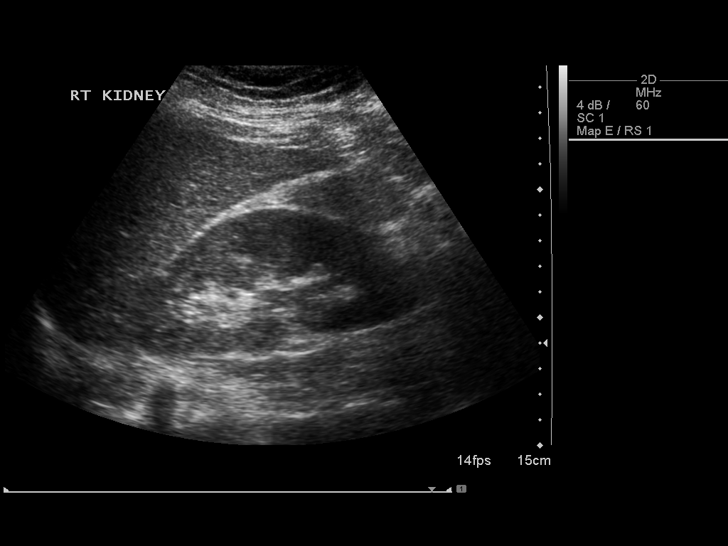
[im 37/60]
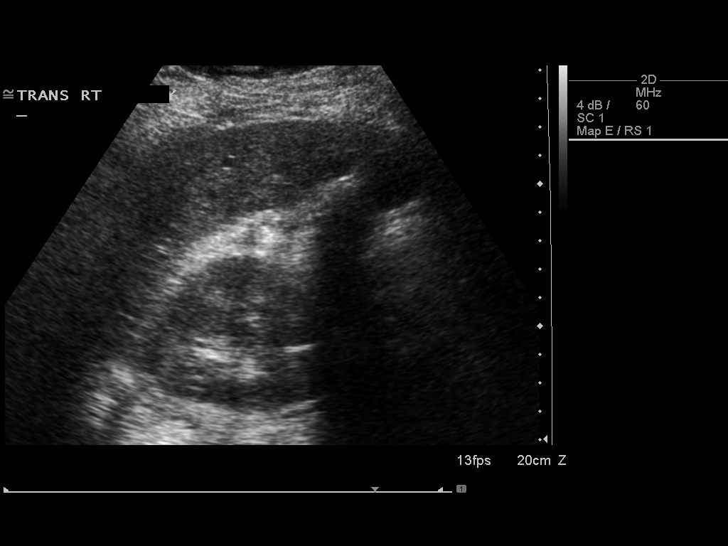
[im 40/60]
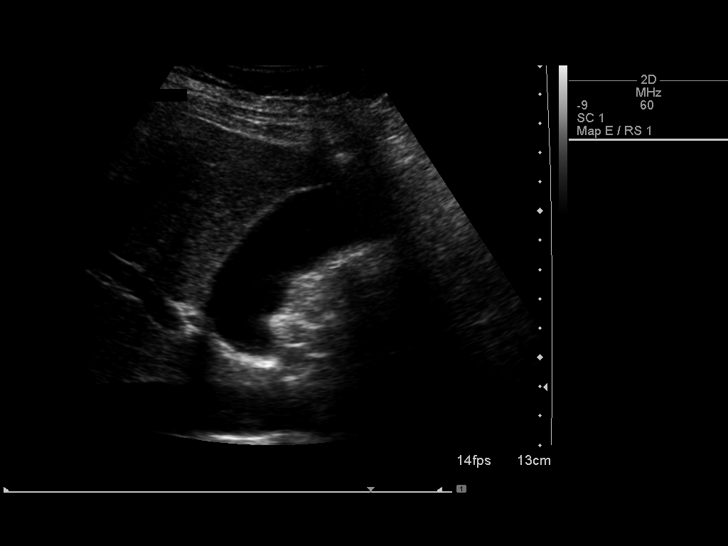
[im 45/60]
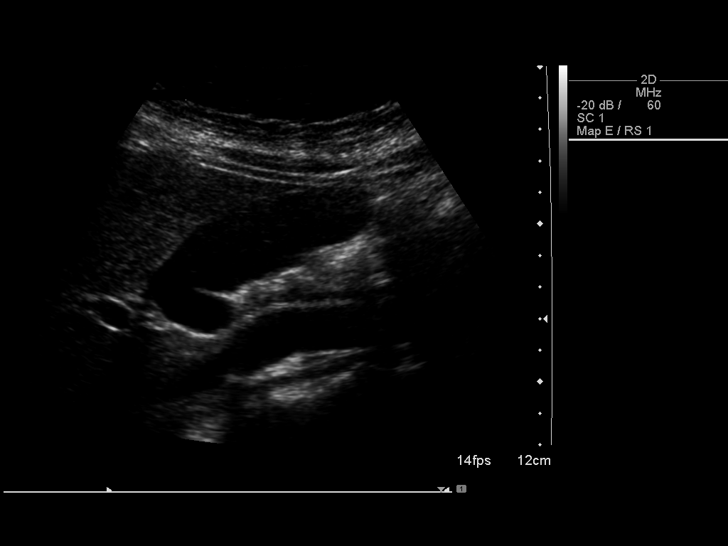
[im 50/60]
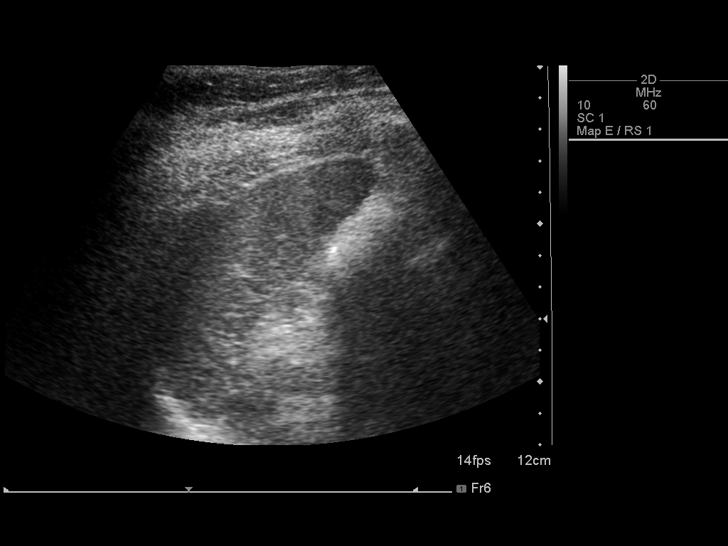
[im 55/60]
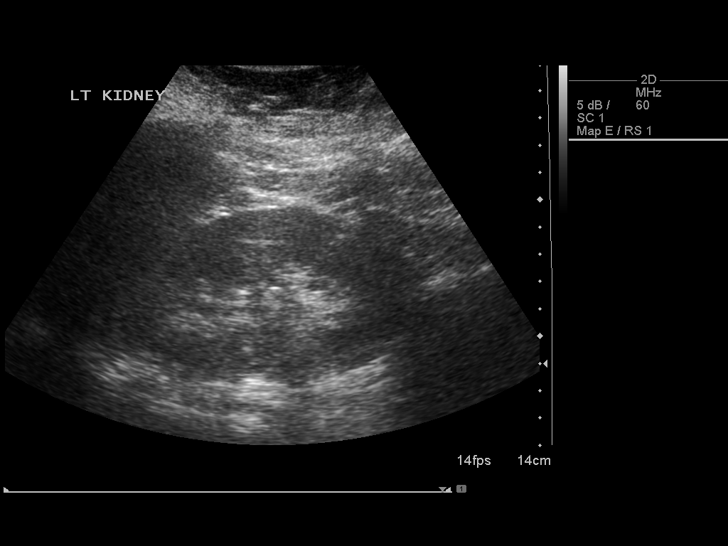
[im 60/60]
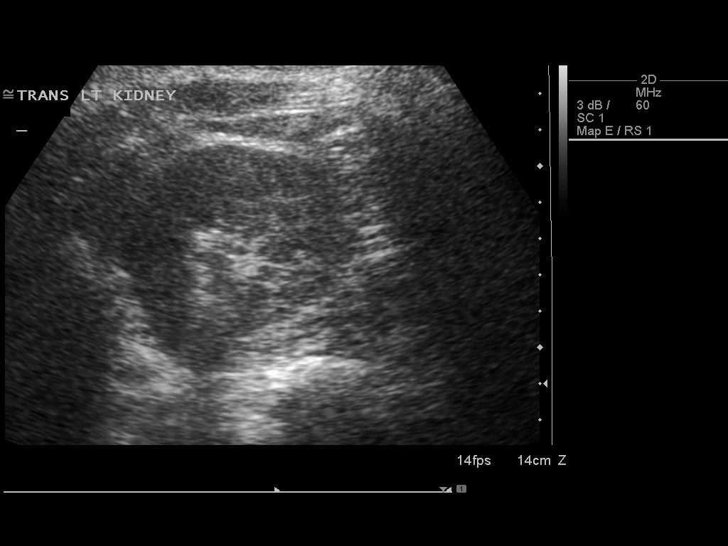

[14 of 25 positions shown; findings below may reference images not displayed]

FINDINGS: Gallbladder:

No gallstones or wall thickening visualized. There is no
pericholecystic fluid. No sonographic Murphy sign noted.

Common bile duct:

Diameter: 2 mm. There is no intrahepatic, common hepatic, or common
bile duct dilatation.

Liver:

No focal lesion identified. Within normal limits in parenchymal
echogenicity.

IVC:

No abnormality visualized.

Pancreas:

Visualized portion unremarkable. Portions of tail of pancreas
obscured by gas.

Spleen:

Size and appearance within normal limits.

Right Kidney:

Length: 11.4 cm. Echogenicity within normal limits. No mass or
hydronephrosis visualized.

Left Kidney:

Length: 10.8 cm. Echogenicity within normal limits. No mass or
hydronephrosis visualized.

Abdominal aorta:

No aneurysm visualized.

Other findings:

No demonstrable ascites.
IMPRESSION: Portions of pancreas obscured by gas. Visualized portions of
pancreas appear normal. Study otherwise unremarkable.

## 2014-09-24 IMAGING — US US THYROID BIOPSY
1 series · 13 of 13 positions shown · non-contrast
Comparison: 04/08/2013

CLINICAL DATA: Dominant right thyroid nodule.

EXAM:
ULTRASOUND GUIDED NEEDLE ASPIRATE BIOPSY OF THE THYROID GLAND

[Series 1: us thyroid biopsy · 0.06mm/px · 13 acquisitions, 13 frames shown]
[im 1/13]
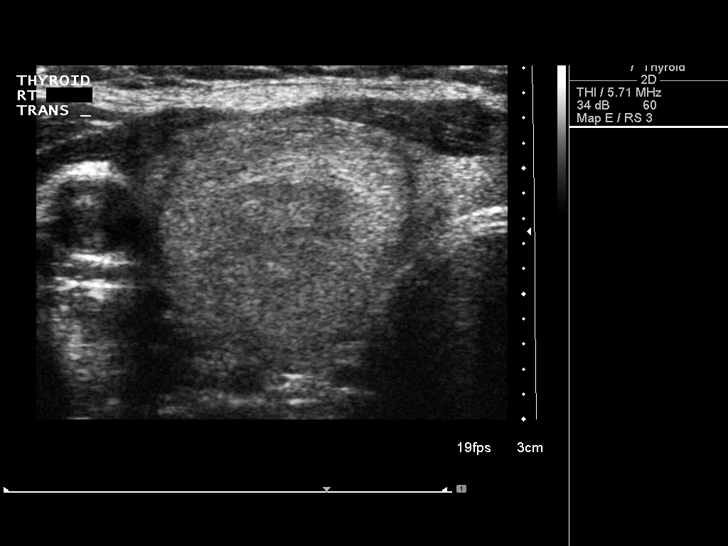
[im 2/13]
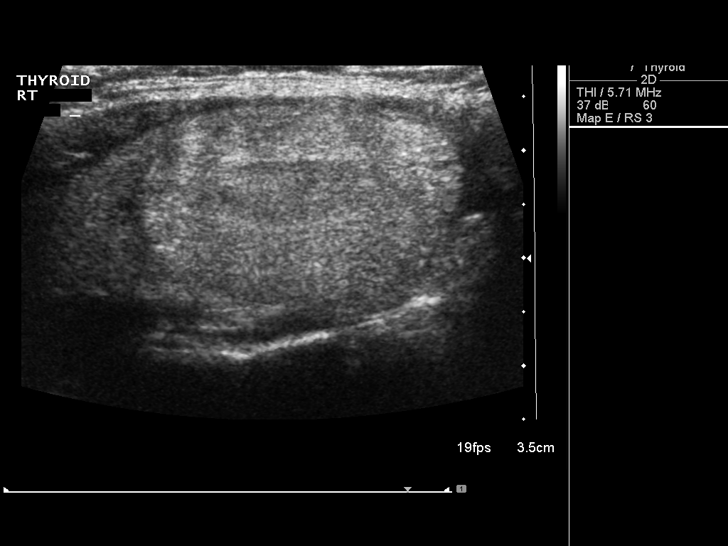
[im 3/13]
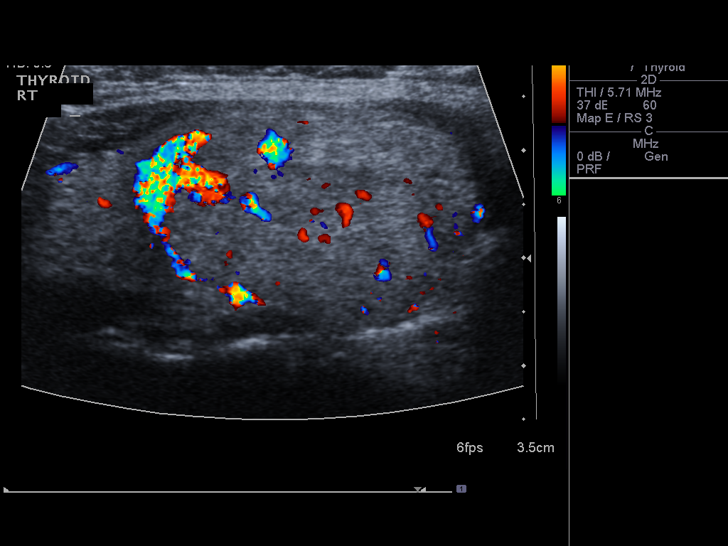
[im 4/13]
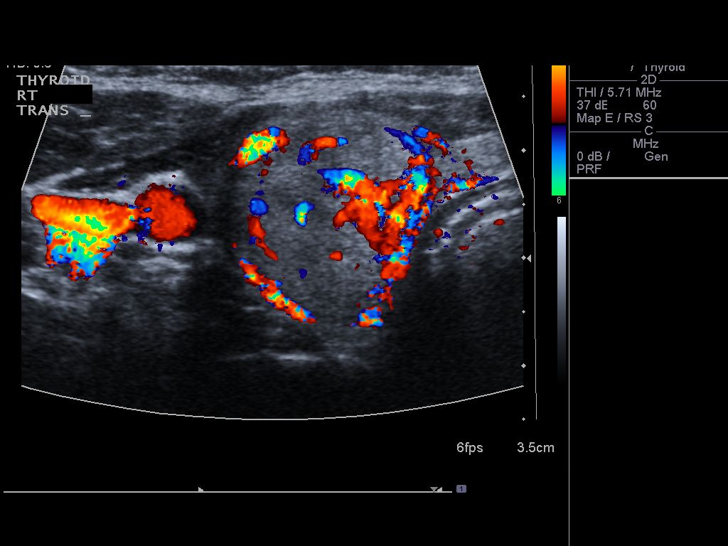
[im 5/13]
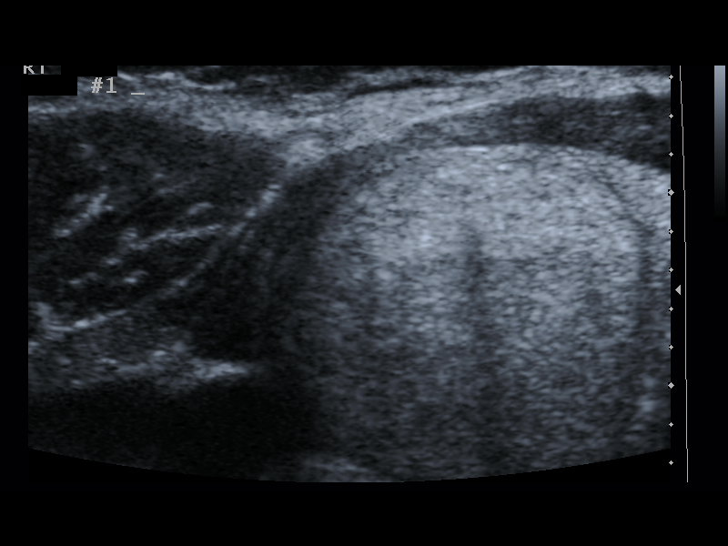
[im 6/13]
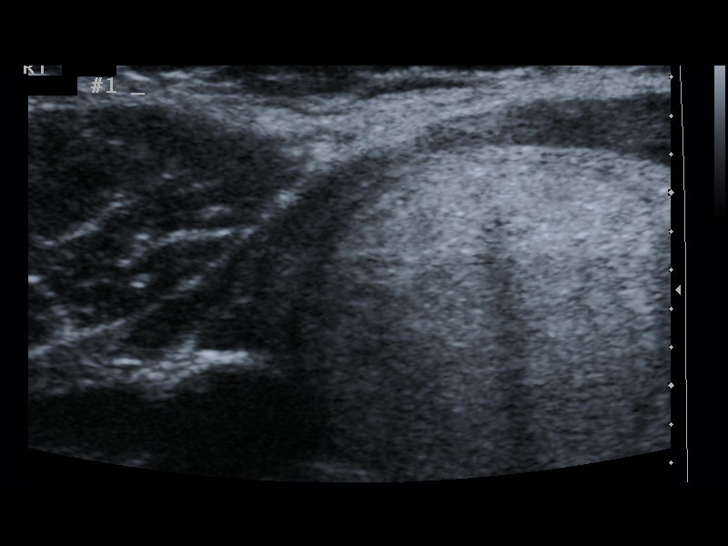
[im 7/13]
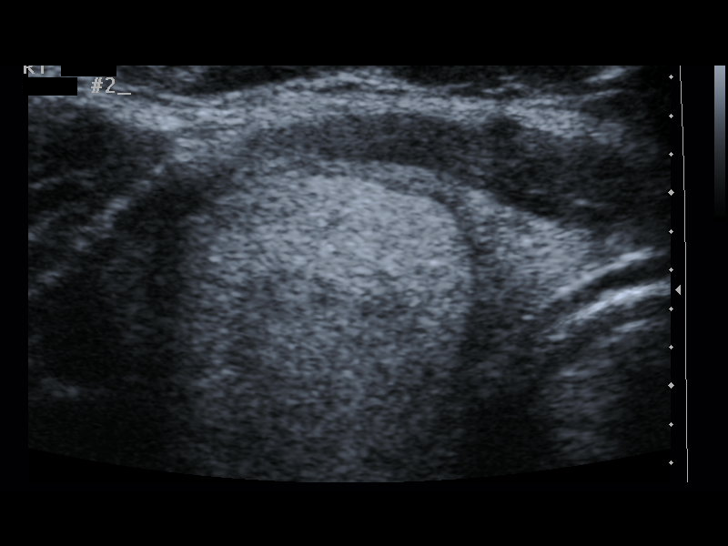
[im 8/13]
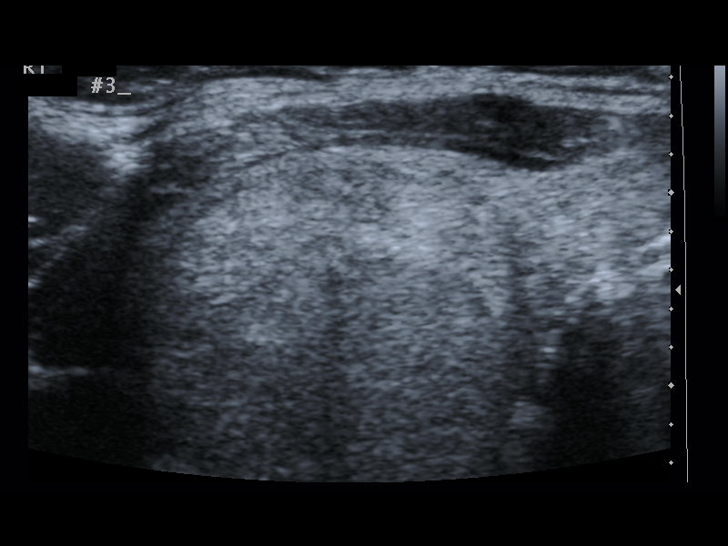
[im 9/13]
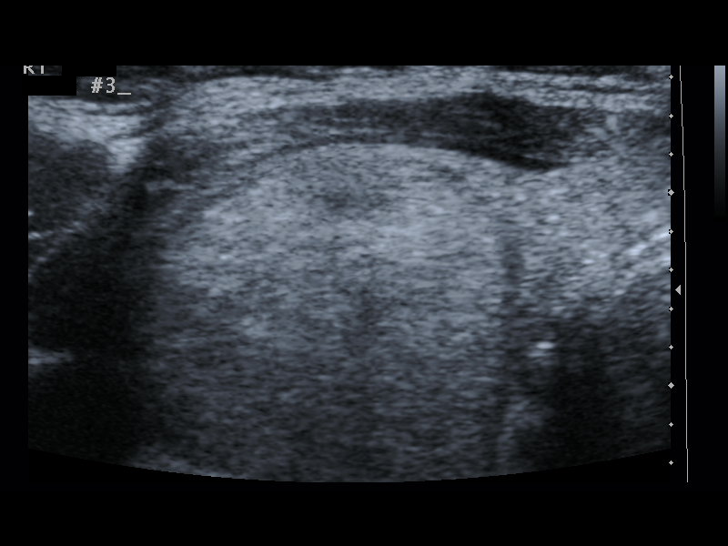
[im 10/13]
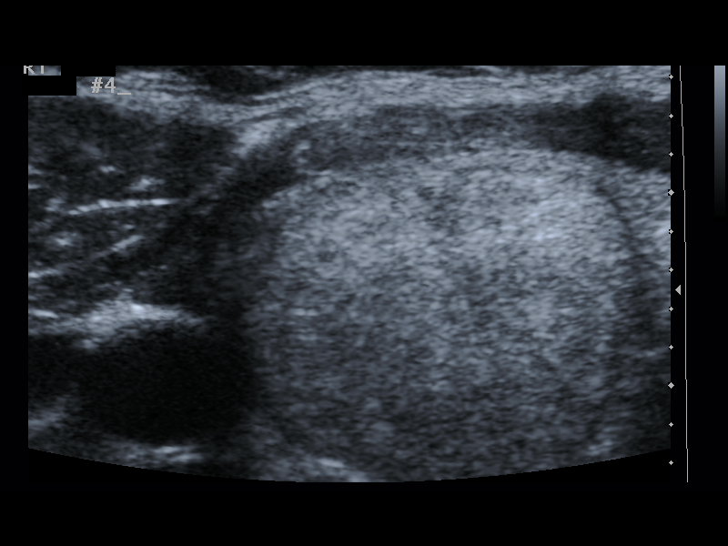
[im 11/13]
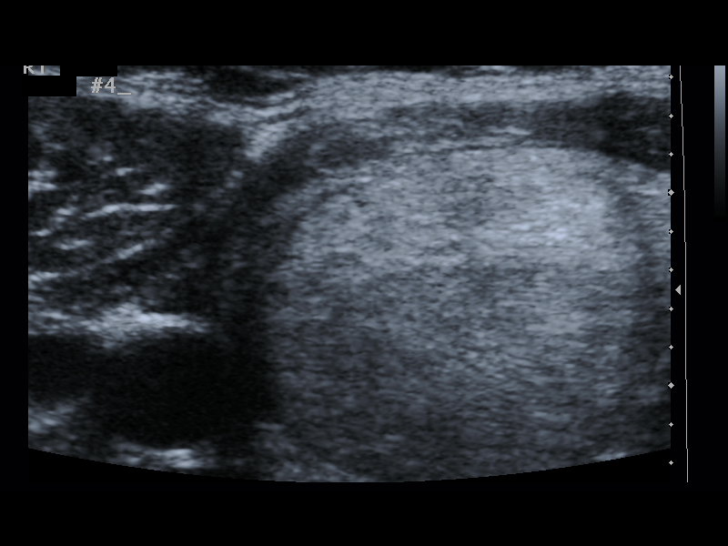
[im 12/13]
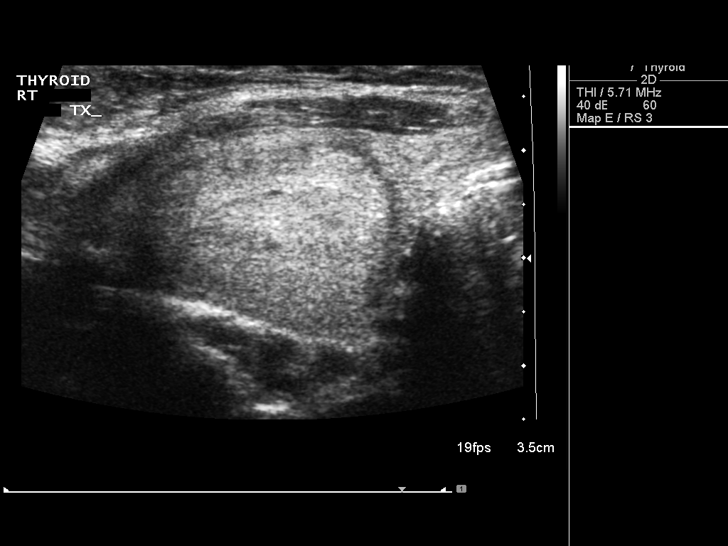
[im 13/13]
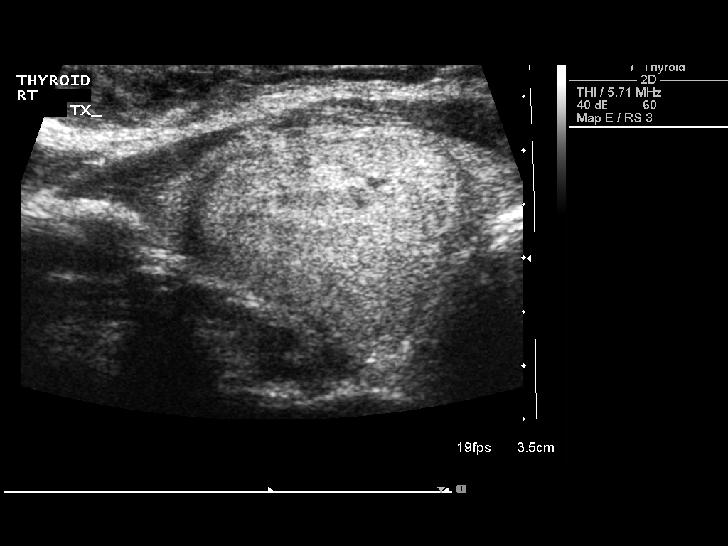

[13 of 13 positions shown; findings below may reference images not displayed]

FINDINGS: Dominant isoechoic nodule in the right mid and lower thyroid lobe.
IMPRESSION: Ultrasound guided needle aspirate biopsy performed of the right
thyroid nodule.

PROCEDURE:
Thyroid biopsy was thoroughly discussed with the patient and
questions were answered. The benefits, risks, alternatives, and
complications were also discussed. The patient understands and
wishes to proceed with the procedure. Written consent was obtained.

Ultrasound was performed to localize and mark an adequate site for
the biopsy. The patient was then prepped and draped in a normal
sterile fashion. Local anesthesia was provided with 1% lidocaine.
Using direct ultrasound guidance, 4 passes were made using needles
into the nodule within the right lobe of the thyroid. Ultrasound was
used to confirm needle placements on all occasions. Specimens were
sent to Pathology for analysis.

Complications:  None

## 2015-01-22 ENCOUNTER — Other Ambulatory Visit: Payer: Self-pay | Admitting: Gastroenterology

## 2015-02-02 ENCOUNTER — Ambulatory Visit (INDEPENDENT_AMBULATORY_CARE_PROVIDER_SITE_OTHER): Payer: BLUE CROSS/BLUE SHIELD | Admitting: Family Medicine

## 2015-02-02 VITALS — BP 122/68 | HR 96 | Temp 98.5°F | Resp 16 | Ht 66.0 in | Wt 252.0 lb

## 2015-02-02 DIAGNOSIS — J069 Acute upper respiratory infection, unspecified: Secondary | ICD-10-CM | POA: Diagnosis not present

## 2015-02-02 MED ORDER — HYDROCODONE-HOMATROPINE 5-1.5 MG/5ML PO SYRP: 5.0000 mL | ORAL_SOLUTION | Freq: Every evening | ORAL | Status: DC | PRN

## 2015-02-02 MED ORDER — SALINE SPRAY 0.65 % NA SOLN: 2.0000 | Freq: Four times a day (QID) | NASAL | Status: DC

## 2015-02-02 MED ORDER — BENZONATATE 100 MG PO CAPS: 100.0000 mg | ORAL_CAPSULE | Freq: Three times a day (TID) | ORAL | Status: DC | PRN

## 2015-02-02 NOTE — Patient Instructions (Signed)
Upper Respiratory Infection, Adult An upper respiratory infection (URI) is also sometimes known as the common cold. The upper respiratory tract includes the nose, sinuses, throat, trachea, and bronchi. Bronchi are the airways leading to the lungs. Most people improve within 1 week, but symptoms can last up to 2 weeks. A residual cough may last even longer.  CAUSES Many different viruses can infect the tissues lining the upper respiratory tract. The tissues become irritated and inflamed and often become very moist. Mucus production is also common. A cold is contagious. You can easily spread the virus to others by oral contact. This includes kissing, sharing a glass, coughing, or sneezing. Touching your mouth or nose and then touching a surface, which is then touched by another person, can also spread the virus. SYMPTOMS  Symptoms typically develop 1 to 3 days after you come in contact with a cold virus. Symptoms vary from person to person. They may include:  Runny nose.  Sneezing.  Nasal congestion.  Sinus irritation.  Sore throat.  Loss of voice (laryngitis).  Cough.  Fatigue.  Muscle aches.  Loss of appetite.  Headache.  Low-grade fever. DIAGNOSIS  You might diagnose your own cold based on familiar symptoms, since most people get a cold 2 to 3 times a year. Your caregiver can confirm this based on your exam. Most importantly, your caregiver can check that your symptoms are not due to another disease such as strep throat, sinusitis, pneumonia, asthma, or epiglottitis. Blood tests, throat tests, and X-rays are not necessary to diagnose a common cold, but they may sometimes be helpful in excluding other more serious diseases. Your caregiver will decide if any further tests are required. RISKS AND COMPLICATIONS  You may be at risk for a more severe case of the common cold if you smoke cigarettes, have chronic heart disease (such as heart failure) or lung disease (such as asthma), or if  you have a weakened immune system. The very young and very old are also at risk for more serious infections. Bacterial sinusitis, middle ear infections, and bacterial pneumonia can complicate the common cold. The common cold can worsen asthma and chronic obstructive pulmonary disease (COPD). Sometimes, these complications can require emergency medical care and may be life-threatening. PREVENTION  The best way to protect against getting a cold is to practice good hygiene. Avoid oral or hand contact with people with cold symptoms. Wash your hands often if contact occurs. There is no clear evidence that vitamin C, vitamin E, echinacea, or exercise reduces the chance of developing a cold. However, it is always recommended to get plenty of rest and practice good nutrition. TREATMENT  Treatment is directed at relieving symptoms. There is no cure. Antibiotics are not effective, because the infection is caused by a virus, not by bacteria. Treatment may include:  Increased fluid intake. Sports drinks offer valuable electrolytes, sugars, and fluids.  Breathing heated mist or steam (vaporizer or shower).  Eating chicken soup or other clear broths, and maintaining good nutrition.  Getting plenty of rest.  Using gargles or lozenges for comfort.  Controlling fevers with ibuprofen or acetaminophen as directed by your caregiver.  Increasing usage of your inhaler if you have asthma. Zinc gel and zinc lozenges, taken in the first 24 hours of the common cold, can shorten the duration and lessen the severity of symptoms. Pain medicines may help with fever, muscle aches, and throat pain. A variety of non-prescription medicines are available to treat congestion and runny nose. Your caregiver   can make recommendations and may suggest nasal or lung inhalers for other symptoms.  HOME CARE INSTRUCTIONS   Only take over-the-counter or prescription medicines for pain, discomfort, or fever as directed by your  caregiver.  Use a warm mist humidifier or inhale steam from a shower to increase air moisture. This may keep secretions moist and make it easier to breathe.  Drink enough water and fluids to keep your urine clear or pale yellow.  Rest as needed.  Return to work when your temperature has returned to normal or as your caregiver advises. You may need to stay home longer to avoid infecting others. You can also use a face mask and careful hand washing to prevent spread of the virus. SEEK MEDICAL CARE IF:   After the first few days, you feel you are getting worse rather than better.  You need your caregiver's advice about medicines to control symptoms.  You develop chills, worsening shortness of breath, or brown or red sputum. These may be signs of pneumonia.  You develop yellow or brown nasal discharge or pain in the face, especially when you bend forward. These may be signs of sinusitis.  You develop a fever, swollen neck glands, pain with swallowing, or white areas in the back of your throat. These may be signs of strep throat. SEEK IMMEDIATE MEDICAL CARE IF:   You have a fever.  You develop severe or persistent headache, ear pain, sinus pain, or chest pain.  You develop wheezing, a prolonged cough, cough up blood, or have a change in your usual mucus (if you have chronic lung disease).  You develop sore muscles or a stiff neck. Document Released: 10/18/2000 Document Revised: 07/17/2011 Document Reviewed: 07/30/2013 Exit[employer] Patient Information 2015 [employer]. This information is not intended to replace advice given to you by your health care provider. Make sure you discuss any questions you have with your health care provider.  

## 2015-02-02 NOTE — Progress Notes (Signed)
[facility] [address] [place] [address] [phone]  Date:  02/02/2015   Name:  Penny Grant   DOB:  1990/10/01   MRN:  [id number]  PCP:  Pcp Not In System    Chief Complaint: URI and Nasal Congestion   History of Present Illness:  Penny Grant is a 24 y.o. very pleasant female patient who presents with the following: - 48 hours of URI symptoms - Cough & congestion - No fevers, chills, or night sweats - URI 2-3 weeks ago.  Taking medication that "looks like pearls" now. [facility]. Started taking flonase today as well.  - Coughing keeping her up at night.   - no hemoptysis.  Yellow/green phlegm - No GI symptoms.  - Mild earache and sore throat.  - Worst symptom is pressure in her head.   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 - Has used a netti pot in the past, but not with this illness.    Patient Active Problem List   Diagnosis Date Noted  . Abdominal pain, epigastric 03/26/2014  . Other constipation 03/26/2014  . IBS (irritable bowel syndrome) 02/18/2014  . Bloating 02/18/2014  . Thyroid nodule 03/28/2013    Past Medical History  Diagnosis Date  . Irritable bowel syndrome July 2014    Past Surgical History  Procedure Laterality Date  . Neg hx      Social History  Substance Use Topics  . Smoking status: Never Smoker   . Smokeless tobacco: Never Used  . Alcohol Use: No    Family History  Problem Relation Age of Onset  . Hypertension Mother   . Hypertension Father   . Heart disease Father   . Diabetes Father   . Lung cancer Maternal Grandmother   . Pancreatic cancer Maternal Grandfather   . Diabetes Maternal Grandfather   . Hyperlipidemia Maternal Grandfather   . Hypertension Maternal Grandfather   . Kidney disease Maternal Grandfather   . Colon cancer Neg Hx   . Colon polyps Neg Hx   . Esophageal cancer Neg Hx     No Known Allergies  Medication list has been  reviewed and updated.  Current Outpatient Prescriptions on File Prior to Visit  Medication Sig Dispense Refill  . desogestrel-ethinyl estradiol (VIORELE) 0.15-0.02/0.01 MG (21/5) tablet Take 1 tablet by mouth daily. Pimtrea, 1 tablet, by mouth, daily    . GLYCOPYRROLATE PO Take 1 tablet by mouth 4 (four) times daily. For sweating    . omeprazole (PRILOSEC) 40 MG capsule Take 1 capsule (40 mg total) by mouth daily. 90 capsule 3  . cetirizine (ZYRTEC) 10 MG tablet Take 1 tablet (10 mg total) by mouth daily. (Patient not taking: Reported on 02/02/2015) 30 tablet 11  . dicyclomine (BENTYL) 20 MG tablet Take one tablet every 6-8 hours as needed for abdominal cramping (Patient not taking: Reported on 02/02/2015) 30 tablet 1  . Probiotic Product (PROBIOTIC DAILY PO) Take 1 tablet by mouth daily. Pt taking Insync     No current facility-administered medications on file prior to visit.    Review of Systems:  Review of Systems  Constitutional: Negative for fever, chills, malaise/fatigue and diaphoresis.  HENT: Positive for congestion and sore throat (very minimal ).   Eyes: Negative for blurred vision, discharge and redness.  Respiratory: Positive for cough and shortness of breath (minimal when going up steps. ). Negative for hemoptysis, sputum production and wheezing.   Cardiovascular:  Negative for chest pain.  Gastrointestinal: Negative for nausea, vomiting, abdominal pain, diarrhea and constipation.  Genitourinary: Negative for dysuria, urgency and frequency.  Musculoskeletal: Positive for back pain (Mild, 5-10 minutes). Negative for myalgias.  Skin: Negative for itching and rash.  Neurological: Positive for headaches. Negative for weakness.    Physical Examination: Filed Vitals:   02/02/15 1920  BP: 122/68  Pulse: 96  Temp: 98.5 F (36.9 C)  Resp: 16   Filed Vitals:   02/02/15 1920  Height:  (1.676 m)  Weight: 252 lb (114.306 kg)   Body mass index is 40.69 kg/(m^2). Ideal  Body Weight: Weight in (lb) to have BMI = 25: 154.6  GEN: WDWN, NAD, Non-toxic, A & O x 3 HEENT: Atraumatic, Normocephalic. Neck supple. No masses, No LAD. No conjunctival erythema Ears and Nose: No external deformity. + congestion and enlarged right nasal turbinate.   CV: RRR, No M/G/R. No JVD. No thrill. No extra heart sounds. PULM: CTA B, no wheezes, crackles, rhonchi. No retractions. No resp. distress. No accessory muscle use. ABD: S, NT, ND, +BS. No rebound. No HSM. EXTR: No c/c/e NEURO Normal gait.  PSYCH: Normally interactive. Conversant. Not depressed or anxious appearing.  Calm demeanor.   Assessment and Plan: URI: mild. 48 hours into illness. No concerning symptoms.  Discussed use of tessalon pearls and cough suppressants (advised to take at night and not drive). Also discussed the use of nasal saline and a netti pot.  F/u PRN  Signed Guinevere Scarlet, MD

## 2015-02-05 NOTE — Progress Notes (Signed)
Agree with A/p. Dr  

## 2015-06-14 ENCOUNTER — Ambulatory Visit (INDEPENDENT_AMBULATORY_CARE_PROVIDER_SITE_OTHER): Payer: BLUE CROSS/BLUE SHIELD | Admitting: Family Medicine

## 2015-06-14 VITALS — BP 120/86 | HR 101 | Temp 98.5°F | Resp 16 | Ht 67.0 in | Wt 248.0 lb

## 2015-06-14 DIAGNOSIS — J069 Acute upper respiratory infection, unspecified: Secondary | ICD-10-CM | POA: Diagnosis not present

## 2015-06-14 DIAGNOSIS — R509 Fever, unspecified: Secondary | ICD-10-CM

## 2015-06-14 DIAGNOSIS — D539 Nutritional anemia, unspecified: Secondary | ICD-10-CM

## 2015-06-14 DIAGNOSIS — J111 Influenza due to unidentified influenza virus with other respiratory manifestations: Secondary | ICD-10-CM

## 2015-06-14 DIAGNOSIS — G8929 Other chronic pain: Secondary | ICD-10-CM

## 2015-06-14 DIAGNOSIS — R1013 Epigastric pain: Secondary | ICD-10-CM | POA: Diagnosis not present

## 2015-06-14 LAB — COMPREHENSIVE METABOLIC PANEL
ALK PHOS: 76 U/L (ref 33–115)
ALT: 16 U/L (ref 6–29)
AST: 19 U/L (ref 10–30)
Albumin: 3.6 g/dL (ref 3.6–5.1)
BILIRUBIN TOTAL: 0.3 mg/dL (ref 0.2–1.2)
BUN: 8 mg/dL (ref 7–25)
CO2: 24 mmol/L (ref 20–31)
CREATININE: 0.61 mg/dL (ref 0.50–1.10)
Calcium: 8.9 mg/dL (ref 8.6–10.2)
Chloride: 103 mmol/L (ref 98–110)
GLUCOSE: 83 mg/dL (ref 65–99)
Potassium: 3.9 mmol/L (ref 3.5–5.3)
SODIUM: 136 mmol/L (ref 135–146)
Total Protein: 6.7 g/dL (ref 6.1–8.1)

## 2015-06-14 LAB — POCT CBC
Granulocyte percent: 60.2 %G (ref 37–80)
HEMATOCRIT: 33.5 % — AB (ref 37.7–47.9)
HEMOGLOBIN: 10.8 g/dL — AB (ref 12.2–16.2)
LYMPH, POC: 1.2 (ref 0.6–3.4)
MCH, POC: 26.3 pg — AB (ref 27–31.2)
MCHC: 32.4 g/dL (ref 31.8–35.4)
MCV: 81.4 fL (ref 80–97)
MID (cbc): 0.1 (ref 0–0.9)
MPV: 7.9 fL (ref 0–99.8)
POC GRANULOCYTE: 2 (ref 2–6.9)
POC LYMPH PERCENT: 35.9 %L (ref 10–50)
POC MID %: 3.9 %M (ref 0–12)
Platelet Count, POC: 269 10*3/uL (ref 142–424)
RBC: 4.12 M/uL (ref 4.04–5.48)
RDW, POC: 15.1 %
WBC: 3.4 10*3/uL — AB (ref 4.6–10.2)

## 2015-06-14 LAB — POCT INFLUENZA A/B
INFLUENZA A, POC: NEGATIVE
Influenza B, POC: POSITIVE — AB

## 2015-06-14 MED ORDER — OSELTAMIVIR PHOSPHATE 75 MG PO CAPS: 75.0000 mg | ORAL_CAPSULE | Freq: Two times a day (BID) | ORAL | Status: AC

## 2015-06-14 MED ORDER — HYDROCODONE-HOMATROPINE 5-1.5 MG/5ML PO SYRP: 5.0000 mL | ORAL_SOLUTION | Freq: Every evening | ORAL | Status: AC | PRN

## 2015-06-14 NOTE — Patient Instructions (Signed)
You have the flu!  Rest and drink plenty of liquids Use the tamiflu twice a day for 5 days to help treat the flu Use the cough syrup as needed- it will make you sleepy, do not drive while taking it Let me know if you do not feel better in the next few days- Sooner if worse.   I am going to have the lab check on your blood counts to see if you are truly anemic

## 2015-06-14 NOTE — Progress Notes (Signed)
[facility] [address] [place] [address] [phone]  Date:  06/14/2015   Name:  Penny Grant   DOB:  1990-07-14   MRN:  [id number]  PCP:  Pcp Not In System    Chief Complaint: Cough; Nasal Congestion; Back Pain; and Abdominal Pain   History of Present Illness:  Penny Grant is a 25 y.o. very pleasant female patient who presents with the following:  Generally healthy woman here today with complaint of illness She has noted a cough, runny nose and HA.  She has noted these sx for the last 3 days Last night her temp was 100.7 She has noted some body aches, fatigue.  No chills She has also noted upper mid back pain- this has been present for a week but recenty worse  She has also noted abd pain during this illness- it is epigastric.  Feels like indigestion  No vomiting or diarrhea.  She is still eating although she does not really want to No ST She took delsym and tylenol so far today  LMP was 05/28/15.    Patient Active Problem List   Diagnosis Date Noted  . Abdominal pain, epigastric 03/26/2014  . Other constipation 03/26/2014  . IBS (irritable bowel syndrome) 02/18/2014  . Bloating 02/18/2014  . Thyroid nodule 03/28/2013    Past Medical History  Diagnosis Date  . Irritable bowel syndrome July 2014    Past Surgical History  Procedure Laterality Date  . Neg hx      Social History  Substance Use Topics  . Smoking status: Never Smoker   . Smokeless tobacco: Never Used  . Alcohol Use: No    Family History  Problem Relation Age of Onset  . Hypertension Mother   . Hypertension Father   . Heart disease Father   . Diabetes Father   . Lung cancer Maternal Grandmother   . Pancreatic cancer Maternal Grandfather   . Diabetes Maternal Grandfather   . Hyperlipidemia Maternal Grandfather   . Hypertension Maternal Grandfather   . Kidney disease Maternal Grandfather   . Colon cancer Neg Hx   . Colon polyps Neg Hx   .  Esophageal cancer Neg Hx     No Known Allergies  Medication list has been reviewed and updated.  Current Outpatient Prescriptions on File Prior to Visit  Medication Sig Dispense Refill  . GLYCOPYRROLATE PO Take 1 tablet by mouth 4 (four) times daily. For sweating    . desogestrel-ethinyl estradiol (VIORELE) 0.15-0.02/0.01 MG (21/5) tablet Take 1 tablet by mouth daily. Pimtrea, 1 tablet, by mouth, daily     No current facility-administered medications on file prior to visit.    Review of Systems:  As per HPI- otherwise negative.   Physical Examination: Filed Vitals:   06/14/15 1145  BP: 120/86  Pulse: 101  Temp: 98.5 F (36.9 C)  Resp: 16   Filed Vitals:   06/14/15 1145  Height:  (1.702 m)  Weight: 248 lb (112.492 kg)   Body mass index is 38.83 kg/(m^2). Ideal Body Weight: Weight in (lb) to have BMI = 25: 159.3  GEN: WDWN, NAD, Non-toxic, A & O x 3, obese, looks well otherwise HEENT: Atraumatic, Normocephalic. Neck supple. No masses, No LAD. Bilateral TM wnl, oropharynx normal.  PEERL,EOMI.   Ears and Nose: No external deformity. CV: RRR, No M/G/R. No JVD. No thrill. No extra heart sounds. PULM: CTA B, no wheezes, crackles, rhonchi. No retractions. No resp. distress. No accessory muscle  use. ABD: S, ND, +BS. No rebound. No HSM.  EXTR: No c/c/e NEURO Normal gait.  PSYCH: Normally interactive. Conversant. Not depressed or anxious appearing.  Calm demeanor.  Mild epigastric and RUQ tenderness   Results for orders placed or performed in visit on 06/14/15  POCT CBC  Result Value Ref Range   WBC 3.4 (A) 4.6 - 10.2 K/uL   Lymph, poc 1.2 0.6 - 3.4   POC LYMPH PERCENT 35.9 10 - 50 %L   MID (cbc) 0.1 0 - 0.9   POC MID % 3.9 0 - 12 %M   POC Granulocyte 2.0 2 - 6.9   Granulocyte percent 60.2 37 - 80 %G   RBC 4.12 4.04 - 5.48 M/uL   Hemoglobin 10.8 (A) 12.2 - 16.2 g/dL   HCT, POC 91.4 (A) 78.2 - 47.9 %   MCV 81.4 80 - 97 fL   MCH, POC 26.3 (A) 27 - 31.2 pg   MCHC  32.4 31.8 - 35.4 g/dL   RDW, POC 95.6 %   Platelet Count, POC 269 142 - 424 K/uL   MPV 7.9 0 - 99.8 fL  POCT Influenza A/B  Result Value Ref Range   Influenza A, POC Negative Negative   Influenza B, POC Positive (A) Negative   Assessment and Plan: Influenza with respiratory manifestation - Plan: oseltamivir (TAMIFLU) 75 MG capsule, HYDROcodone-homatropine (HYCODAN) 5-1.5 MG/5ML syrup  Low grade fever - Plan: POCT Influenza A/B  Abdominal pain, chronic, epigastric - Plan: POCT CBC, Comprehensive metabolic panel  Acute upper respiratory infection - Plan: HYDROcodone-homatropine (HYCODAN) 5-1.5 MG/5ML syrup  Deficiency anemia - Plan: Hemoglobinopathy evaluation  Here today with flu Start in tamiflu and hycodan as needed for cough Her CBC is reassuring that she does not have any other acute abd pathology but she will let me know if sx persist- OW sx are likely due to flu She has a long history of "anemia" but her MCV is normal. Will check for possible thalassemia   Signed Abbe Amsterdam, MD

## 2015-06-16 ENCOUNTER — Telehealth: Payer: Self-pay

## 2015-06-16 LAB — HEMOGLOBINOPATHY EVALUATION
HGB S QUANTITAION: 0 %
Hemoglobin Other: 0 %
Hgb A2 Quant: 2.2 % (ref 2.2–3.2)
Hgb A: 97.8 % (ref 96.8–97.8)
Hgb F Quant: 0 % (ref 0.0–2.0)

## 2015-06-16 NOTE — Telephone Encounter (Signed)
Called her back but no answer- LMOM.  The flu generally lasts a week or a little more so I am not surprised that she is not feeling well yet.  However if she feels that her sx have worsened or changed we are certainly glad to see her and make sure that all is well

## 2015-06-16 NOTE — Telephone Encounter (Signed)
Pt just seen on the 6th.

## 2015-06-16 NOTE — Telephone Encounter (Signed)
PT states she has not gotten better would like a CB for phone consultation/// She asked how long should it take to get over the flu and if she should return to the office for re-eval.   [phone] Please call

## 2015-06-17 ENCOUNTER — Emergency Department (HOSPITAL_COMMUNITY)
Admission: EM | Admit: 2015-06-17 | Discharge: 2015-06-17 | Disposition: A | Discharge status: Home / Self Care | Payer: BLUE CROSS/BLUE SHIELD | Attending: Emergency Medicine | Admitting: Emergency Medicine

## 2015-06-17 ENCOUNTER — Emergency Department (HOSPITAL_COMMUNITY): Payer: BLUE CROSS/BLUE SHIELD

## 2015-06-17 ENCOUNTER — Encounter (HOSPITAL_COMMUNITY): Payer: Self-pay

## 2015-06-17 DIAGNOSIS — Z79899 Other long term (current) drug therapy: Secondary | ICD-10-CM | POA: Insufficient documentation

## 2015-06-17 DIAGNOSIS — Z8719 Personal history of other diseases of the digestive system: Secondary | ICD-10-CM | POA: Diagnosis not present

## 2015-06-17 DIAGNOSIS — R079 Chest pain, unspecified: Secondary | ICD-10-CM

## 2015-06-17 DIAGNOSIS — J111 Influenza due to unidentified influenza virus with other respiratory manifestations: Secondary | ICD-10-CM | POA: Diagnosis not present

## 2015-06-17 DIAGNOSIS — Z793 Long term (current) use of hormonal contraceptives: Secondary | ICD-10-CM | POA: Insufficient documentation

## 2015-06-17 DIAGNOSIS — R0602 Shortness of breath: Secondary | ICD-10-CM

## 2015-06-17 DIAGNOSIS — R000 Tachycardia, unspecified: Secondary | ICD-10-CM | POA: Diagnosis not present

## 2015-06-17 DIAGNOSIS — R05 Cough: Secondary | ICD-10-CM

## 2015-06-17 DIAGNOSIS — R059 Cough, unspecified: Secondary | ICD-10-CM

## 2015-06-17 MED ORDER — IPRATROPIUM-ALBUTEROL 0.5-2.5 (3) MG/3ML IN SOLN
3.0000 mL | Freq: Once | RESPIRATORY_TRACT | Status: AC
  Administered 2015-06-17: 3 mL via RESPIRATORY_TRACT
  Filled 2015-06-17: qty 3

## 2015-06-17 MED ORDER — SODIUM CHLORIDE 0.9 % IV BOLUS (SEPSIS)
1000.0000 mL | Freq: Once | INTRAVENOUS | Status: AC
  Administered 2015-06-17: 1000 mL via INTRAVENOUS

## 2015-06-17 MED ORDER — ONDANSETRON HCL 4 MG/2ML IJ SOLN
4.0000 mg | Freq: Once | INTRAMUSCULAR | Status: AC
  Administered 2015-06-17: 4 mg via INTRAVENOUS
  Filled 2015-06-17: qty 2

## 2015-06-17 NOTE — Discharge Instructions (Signed)

## 2015-06-17 NOTE — ED Notes (Signed)
Pt c/o SOB x 5 days and dull central chest pain starting last night.  Pain score 5/10.  Pt was diagnosed w/ Flu B x 3 days ago.

## 2015-06-17 NOTE — Progress Notes (Signed)
[facility] ED CM noted pt with coverage but no pcp listed [facility] ED CM spoke with pt on how to obtain an in network pcp with insurance coverage via the customer service number or web site  Cm reviewed ED level of care for crisis/emergent services and community pcp level of care to manage continuous or chronic medical concerns.  The pt voiced understanding CM encouraged pt and discussed pt's responsibility to verify with pt's insurance carrier that any recommended medical provider offered by any emergency room or a hospital provider is within the carrier's network. The pt voiced understanding   

## 2015-06-17 NOTE — ED Provider Notes (Signed)
CSN: [id number]     Arrival date & time 06/17/15  1035 History   First MD Initiated Contact with Patient 06/17/15 1038     Chief Complaint  Patient presents with  . Shortness of Breath  . Influenza  . Chest Pain     (Consider location/radiation/quality/duration/timing/severity/associated sxs/prior Treatment) HPI Comments: Patient presents to the emergency department with chief complaint of chest pain and shortness of breath. She states that her symptoms started about 5 days ago. She was diagnosed with influenza B at urgent care. She has been taking Tamiflu. She states that she has since developed a productive cough. She was seen at urgent care again yesterday and prescribed doxycycline. She states that her symptoms worsened overnight, and she was instructed to come to the emergency department. She has not had a chest x-ray. She denies fevers, chills, nausea, or vomiting. There are no other aggravating or alleviating factors. She denies any abdominal pain, or dysuria.  The history is provided by the patient. No language interpreter was used.    Past Medical History  Diagnosis Date  . Irritable bowel syndrome July 2014   Past Surgical History  Procedure Laterality Date  . Neg hx     Family History  Problem Relation Age of Onset  . Hypertension Mother   . Hypertension Father   . Heart disease Father   . Diabetes Father   . Lung cancer Maternal Grandmother   . Pancreatic cancer Maternal Grandfather   . Diabetes Maternal Grandfather   . Hyperlipidemia Maternal Grandfather   . Hypertension Maternal Grandfather   . Kidney disease Maternal Grandfather   . Colon cancer Neg Hx   . Colon polyps Neg Hx   . Esophageal cancer Neg Hx    Social History  Substance Use Topics  . Smoking status: Never Smoker   . Smokeless tobacco: Never Used  . Alcohol Use: No   OB History    No data available     Review of Systems  Constitutional: Positive for chills. Negative for fever.  HENT:  Positive for postnasal drip, rhinorrhea, sinus pressure, sneezing and sore throat.   Respiratory: Positive for cough and shortness of breath.   Cardiovascular: Positive for chest pain.  Gastrointestinal: Negative for nausea, vomiting, abdominal pain, diarrhea and constipation.  Genitourinary: Negative for dysuria.  All other systems reviewed and are negative.     Allergies  Review of patient's allergies indicates no known allergies.  Home Medications   Prior to Admission medications   Medication Sig Start Date End Date Taking? Authorizing Provider  desogestrel-ethinyl estradiol (VIORELE) 0.15-0.02/0.01 MG (21/5) tablet Take 1 tablet by mouth daily. Pimtrea, 1 tablet, by mouth, daily    Historical Provider, MD  GLYCOPYRROLATE PO Take 1 tablet by mouth 4 (four) times daily. For sweating    Historical Provider, MD  HYDROcodone-homatropine (HYCODAN) 5-1.5 MG/5ML syrup Take 5 mLs by mouth at bedtime as needed for cough. 06/14/15   Pearline Cables, MD  oseltamivir (TAMIFLU) 75 MG capsule Take 1 capsule (75 mg total) by mouth 2 (two) times daily. 06/14/15   Gwenlyn Found Copland, MD   BP 144/119 mmHg  Pulse 99  Temp(Src) 98.2 F (36.8 C) (Oral)  Resp 18  SpO2 100% Physical Exam  Constitutional: She is oriented to person, place, and time. She appears well-developed and well-nourished.  HENT:  Head: Normocephalic and atraumatic.  Eyes: Conjunctivae and EOM are normal. Pupils are equal, round, and reactive to light.  Neck: Normal range of motion. Neck  supple.  Cardiovascular: Regular rhythm.  Exam reveals no gallop and no friction rub.   No murmur heard. Mildly tachycardic  Pulmonary/Chest: Effort normal and breath sounds normal. No respiratory distress. She has no wheezes. She has no rales. She exhibits no tenderness.  CTAB  Abdominal: Soft. Bowel sounds are normal. She exhibits no distension and no mass. There is no tenderness. There is no rebound and no guarding.  No focal abdominal  tenderness, no RLQ tenderness or pain at McBurney's point, no RUQ tenderness or Murphy's sign, no left-sided abdominal tenderness, no fluid wave, or signs of peritonitis   Musculoskeletal: Normal range of motion. She exhibits no edema or tenderness.  Neurological: She is alert and oriented to person, place, and time.  Skin: Skin is warm and dry.  Psychiatric: She has a normal mood and affect. Her behavior is normal. Judgment and thought content normal.  Nursing note and vitals reviewed.   ED Course  Procedures (including critical care time)  Results for orders placed or performed in visit on 06/14/15  Comprehensive metabolic panel  Result Value Ref Range   Sodium 136 135 - 146 mmol/L   Potassium 3.9 3.5 - 5.3 mmol/L   Chloride 103 98 - 110 mmol/L   CO2 24 20 - 31 mmol/L   Glucose, Bld 83 65 - 99 mg/dL   BUN 8 7 - 25 mg/dL   Creat 1.61 0.96 - 0.45 mg/dL   Total Bilirubin 0.3 0.2 - 1.2 mg/dL   Alkaline Phosphatase 76 33 - 115 U/L   AST 19 10 - 30 U/L   ALT 16 6 - 29 U/L   Total Protein 6.7 6.1 - 8.1 g/dL   Albumin 3.6 3.6 - 5.1 g/dL   Calcium 8.9 8.6 - 40.9 mg/dL  Hemoglobinopathy evaluation  Result Value Ref Range   Hgb A 97.8 96.8 - 97.8 %   Hgb A2 Quant 2.2 2.2 - 3.2 %   Hgb F Quant 0.0 0.0 - 2.0 %   Hgb S Quant 0.0 0.0 %   Hemoglobin Other 0.0 0.0 %  POCT CBC  Result Value Ref Range   WBC 3.4 (A) 4.6 - 10.2 K/uL   Lymph, poc 1.2 0.6 - 3.4   POC LYMPH PERCENT 35.9 10 - 50 %L   MID (cbc) 0.1 0 - 0.9   POC MID % 3.9 0 - 12 %M   POC Granulocyte 2.0 2 - 6.9   Granulocyte percent 60.2 37 - 80 %G   RBC 4.12 4.04 - 5.48 M/uL   Hemoglobin 10.8 (A) 12.2 - 16.2 g/dL   HCT, POC 81.1 (A) 91.4 - 47.9 %   MCV 81.4 80 - 97 fL   MCH, POC 26.3 (A) 27 - 31.2 pg   MCHC 32.4 31.8 - 35.4 g/dL   RDW, POC 78.2 %   Platelet Count, POC 269 142 - 424 K/uL   MPV 7.9 0 - 99.8 fL  POCT Influenza A/B  Result Value Ref Range   Influenza A, POC Negative Negative   Influenza B, POC Positive  (A) Negative   Dg Chest 2 View  06/17/2015  CLINICAL DATA:  Shortness of breath for 5 days. Chest pain for 1 day EXAM: CHEST  2 VIEW COMPARISON:  None. FINDINGS: Lungs are clear. Heart size and pulmonary vascularity are normal. No adenopathy. No pneumothorax. No bone lesions. IMPRESSION: No abnormality noted. Electronically Signed   By: Bretta Bang III M.D.   On: 06/17/2015 11:59  I have personally reviewed and evaluated these images and lab results as part of my medical decision-making.   MDM   Final diagnoses:  Cough  SOB (shortness of breath)  Influenza  Chest pain, unspecified chest pain type    Patient with shortness of breath, chest pain, and cough. Symptoms been progressively worsening for the past 5 days. She was recently diagnosed with influenza and is being treated with Tamiflu. Also taking doxycycline. Will check chest x-ray today. Will give fluids that she is mildly tachycardic. Doubt PE or ACS; suspect symptoms are more related to URI. O2 sat is 100%.  12:52 PM Patient states that she is feeling improved. Vital signs are stable, patient is afebrile. Suspect the symptoms are all related to flu. Possibly intercostal strain from coughing. Patient has taken Tamiflu, and doxycycline. She also has cough medicine at home. I have encouraged patient to rehydrate aggressively, and to rest. Return cautions discussed. Patient is well-appearing. She is stable and ready for discharge.  Filed Vitals:   06/17/15 1046 06/17/15 1255  BP: 144/119 119/73  Pulse: 99 78  Temp: 98.2 F (36.8 C) 98 F (36.7 C)  Resp: 18 [address] [place] 06/17/15 1509  Raeford Razor, MD 06/18/15 [phone]

## 2015-06-17 NOTE — ED Notes (Signed)
Pt reports feeling better after the breathing tx.

## 2015-06-20 ENCOUNTER — Encounter: Payer: Self-pay | Admitting: Family Medicine
